# Patient Record
Sex: Female | Born: 1945 | Race: White | Hispanic: No | Marital: Married | Smoking: Never smoker
Health system: Southern US, Community
[De-identification: ages and names within clinical notes are randomized; demographics above are authoritative.]

## PROBLEM LIST (undated history)

## (undated) DIAGNOSIS — C50919 Malignant neoplasm of unspecified site of unspecified female breast: Secondary | ICD-10-CM

## (undated) DIAGNOSIS — E785 Hyperlipidemia, unspecified: Secondary | ICD-10-CM

## (undated) DIAGNOSIS — I10 Essential (primary) hypertension: Secondary | ICD-10-CM

## (undated) DIAGNOSIS — Z7901 Long term (current) use of anticoagulants: Secondary | ICD-10-CM

## (undated) DIAGNOSIS — R002 Palpitations: Secondary | ICD-10-CM

## (undated) DIAGNOSIS — E039 Hypothyroidism, unspecified: Secondary | ICD-10-CM

## (undated) DIAGNOSIS — E875 Hyperkalemia: Secondary | ICD-10-CM

## (undated) DIAGNOSIS — I480 Paroxysmal atrial fibrillation: Secondary | ICD-10-CM

## (undated) DIAGNOSIS — I2510 Atherosclerotic heart disease of native coronary artery without angina pectoris: Secondary | ICD-10-CM

## (undated) HISTORY — DX: Long term (current) use of anticoagulants: Z79.01

## (undated) HISTORY — PX: BREAST BIOPSY: SHX20

## (undated) HISTORY — DX: Atherosclerotic heart disease of native coronary artery without angina pectoris: I25.10

## (undated) HISTORY — DX: Essential (primary) hypertension: I10

## (undated) HISTORY — PX: TUBAL LIGATION: SHX77

## (undated) HISTORY — PX: HALLUX VALGUS CORRECTION: SUR315

## (undated) HISTORY — DX: Malignant neoplasm of unspecified site of unspecified female breast: C50.919

## (undated) HISTORY — DX: Palpitations: R00.2

## (undated) HISTORY — DX: Hyperlipidemia, unspecified: E78.5

## (undated) HISTORY — PX: OTHER SURGICAL HISTORY: SHX169

## (undated) HISTORY — DX: Hypothyroidism, unspecified: E03.9

## (undated) HISTORY — DX: Hyperkalemia: E87.5

## (undated) HISTORY — DX: Paroxysmal atrial fibrillation: I48.0

## (undated) HISTORY — PX: ARTHROPLASTY: SHX135

---

## 2014-08-14 DIAGNOSIS — I480 Paroxysmal atrial fibrillation: Secondary | ICD-10-CM | POA: Insufficient documentation

## 2014-08-14 DIAGNOSIS — I2510 Atherosclerotic heart disease of native coronary artery without angina pectoris: Secondary | ICD-10-CM

## 2014-08-14 DIAGNOSIS — I482 Chronic atrial fibrillation, unspecified: Secondary | ICD-10-CM | POA: Insufficient documentation

## 2014-08-14 DIAGNOSIS — E785 Hyperlipidemia, unspecified: Secondary | ICD-10-CM

## 2014-08-14 DIAGNOSIS — I10 Essential (primary) hypertension: Secondary | ICD-10-CM

## 2014-08-14 DIAGNOSIS — I119 Hypertensive heart disease without heart failure: Secondary | ICD-10-CM | POA: Insufficient documentation

## 2014-08-14 DIAGNOSIS — Z7901 Long term (current) use of anticoagulants: Secondary | ICD-10-CM

## 2014-08-14 HISTORY — DX: Hypertensive heart disease without heart failure: I11.9

## 2014-08-14 HISTORY — DX: Paroxysmal atrial fibrillation: I48.0

## 2014-08-14 HISTORY — DX: Atherosclerotic heart disease of native coronary artery without angina pectoris: I25.10

## 2014-08-14 HISTORY — DX: Chronic atrial fibrillation, unspecified: I48.20

## 2014-08-14 HISTORY — DX: Essential (primary) hypertension: I10

## 2014-08-14 HISTORY — DX: Long term (current) use of anticoagulants: Z79.01

## 2014-08-14 HISTORY — DX: Hyperlipidemia, unspecified: E78.5

## 2016-03-11 DIAGNOSIS — M1811 Unilateral primary osteoarthritis of first carpometacarpal joint, right hand: Secondary | ICD-10-CM | POA: Diagnosis not present

## 2016-03-18 DIAGNOSIS — E785 Hyperlipidemia, unspecified: Secondary | ICD-10-CM | POA: Diagnosis not present

## 2016-03-24 DIAGNOSIS — I2510 Atherosclerotic heart disease of native coronary artery without angina pectoris: Secondary | ICD-10-CM | POA: Diagnosis not present

## 2016-03-24 DIAGNOSIS — T50905A Adverse effect of unspecified drugs, medicaments and biological substances, initial encounter: Secondary | ICD-10-CM | POA: Insufficient documentation

## 2016-03-24 DIAGNOSIS — E875 Hyperkalemia: Secondary | ICD-10-CM

## 2016-03-24 HISTORY — DX: Hyperkalemia: E87.5

## 2016-03-24 HISTORY — DX: Adverse effect of unspecified drugs, medicaments and biological substances, initial encounter: T50.905A

## 2016-03-25 DIAGNOSIS — Z7901 Long term (current) use of anticoagulants: Secondary | ICD-10-CM | POA: Diagnosis not present

## 2016-03-25 DIAGNOSIS — E875 Hyperkalemia: Secondary | ICD-10-CM | POA: Diagnosis not present

## 2016-03-25 DIAGNOSIS — E785 Hyperlipidemia, unspecified: Secondary | ICD-10-CM | POA: Diagnosis not present

## 2016-03-25 DIAGNOSIS — I10 Essential (primary) hypertension: Secondary | ICD-10-CM | POA: Diagnosis not present

## 2016-03-25 DIAGNOSIS — I481 Persistent atrial fibrillation: Secondary | ICD-10-CM | POA: Diagnosis not present

## 2016-04-08 DIAGNOSIS — I480 Paroxysmal atrial fibrillation: Secondary | ICD-10-CM | POA: Diagnosis not present

## 2016-06-24 DIAGNOSIS — E875 Hyperkalemia: Secondary | ICD-10-CM | POA: Diagnosis not present

## 2016-06-24 DIAGNOSIS — I10 Essential (primary) hypertension: Secondary | ICD-10-CM | POA: Diagnosis not present

## 2016-06-24 DIAGNOSIS — Z6824 Body mass index (BMI) 24.0-24.9, adult: Secondary | ICD-10-CM | POA: Diagnosis not present

## 2016-08-04 DIAGNOSIS — E782 Mixed hyperlipidemia: Secondary | ICD-10-CM | POA: Diagnosis not present

## 2016-08-04 DIAGNOSIS — Z1159 Encounter for screening for other viral diseases: Secondary | ICD-10-CM | POA: Diagnosis not present

## 2016-08-04 DIAGNOSIS — Z79899 Other long term (current) drug therapy: Secondary | ICD-10-CM | POA: Diagnosis not present

## 2016-08-04 DIAGNOSIS — Z0000 Encounter for general adult medical examination without abnormal findings: Secondary | ICD-10-CM | POA: Diagnosis not present

## 2016-08-04 DIAGNOSIS — E039 Hypothyroidism, unspecified: Secondary | ICD-10-CM | POA: Diagnosis not present

## 2016-08-04 DIAGNOSIS — I10 Essential (primary) hypertension: Secondary | ICD-10-CM | POA: Diagnosis not present

## 2016-08-21 DIAGNOSIS — H2513 Age-related nuclear cataract, bilateral: Secondary | ICD-10-CM | POA: Diagnosis not present

## 2016-09-17 ENCOUNTER — Other Ambulatory Visit: Payer: Self-pay

## 2016-09-17 DIAGNOSIS — E039 Hypothyroidism, unspecified: Secondary | ICD-10-CM

## 2016-09-17 DIAGNOSIS — R002 Palpitations: Secondary | ICD-10-CM

## 2016-09-17 HISTORY — DX: Palpitations: R00.2

## 2016-09-17 HISTORY — DX: Hypothyroidism, unspecified: E03.9

## 2016-09-22 DIAGNOSIS — Z1231 Encounter for screening mammogram for malignant neoplasm of breast: Secondary | ICD-10-CM | POA: Diagnosis not present

## 2016-09-24 NOTE — Progress Notes (Signed)
Cardiology Office Note:    Date:  09/25/2016   ID:  Gloria Harrington, DOB [birth year], MRN [id number]  PCP:  Mateo Flow, MD  Cardiologist:  Shirlee More, MD    Referring MD: No ref. provider found    ASSESSMENT:    1. Permanent atrial fibrillation ([place])   2. Chronic anticoagulation   3. Hypertensive heart disease without heart failure   4. Hyperlipidemia, unspecified hyperlipidemia type    PLAN:    In order of problems listed above:  1. Stable rate controlled continue her beta blocker and anti-coagulant 2. Stable no bleeding complication continue her anticoagulant. 3. Stable blood pressure target continue  4. Stable continue her statin   Next appointment: [phone]   Medication Adjustments/Labs and Tests Ordered: Current medicines are reviewed at length with the patient today.  Concerns regarding medicines are outlined above.  No orders of the defined types were placed in this encounter.  No orders of the defined types were placed in this encounter.   Chief Complaint  Patient presents with  . Follow-up    Routine 6 month flup appt     History of Present Illness:    Gloria Harrington is a 71 y.o. female with a hx of hypertensive heart disease, Permanent Atrial Fibrillation with anticoagulation, Dyslipidemia, and hyperkalemia with ACEI  last seen one year ago.she is pleased with the quality of her life and remainsl vigorous active and is only short of breath after climbing stairs. She's had no chest pain edema palpitation syncope TIA or bleeding. Compliance with diet, lifestyle and medications: yes Past Medical History:  Diagnosis Date  . Chronic anticoagulation 08/14/2014  . Coronary artery calcification seen on CT scan 08/14/2014  . Essential hypertension 08/14/2014  . Hyperkalemia 03/24/2016  . Hyperlipidemia 08/14/2014  . Hypothyroid 09/17/2016  . PAF (paroxysmal atrial fibrillation) ([place]) 08/14/2014   Overview:  CHADS2 vasc score=4  . Palpitation 09/17/2016    Past  Surgical History:  Procedure Laterality Date  . ARTHROPLASTY     for hammertoe  . BREAST BIOPSY    . HALLUX VALGUS CORRECTION    . open tenotomy of extensor second toe    . TUBAL LIGATION      Current Medications: Current Meds  Medication Sig  . calcium-vitamin D (OSCAL WITH D) 500-200 MG-UNIT tablet Take 1 tablet by mouth 2 (two) times daily.  Marland Kitchen ELIQUIS 5 MG TABS tablet Take 5 mg by mouth 2 (two) times daily.  . Garlic 3151 MG CAPS Take 1 capsule by mouth 2 (two) times daily.  Marland Kitchen levothyroxine (SYNTHROID, LEVOTHROID) 75 MCG tablet Take 75 mcg by mouth daily.  Marland Kitchen lisinopril (PRINIVIL,ZESTRIL) 10 MG tablet Take 10 mg by mouth daily.  . metoprolol tartrate (LOPRESSOR) 25 MG tablet Take 25 mg by mouth 2 (two) times daily.  . Multiple Vitamin (MULTI-VITAMINS) TABS Take 1 tablet by mouth daily.  . Omega-3 Fatty Acids (FISH OIL) 1000 MG CAPS Take 2 capsules by mouth 2 (two) times daily.  . pravastatin (PRAVACHOL) 40 MG tablet Take 40 mg by mouth daily.     Allergies:   Clindamycin; Latex; Nitrofurantoin; Penicillins; Prednisone; and Sulfamethoxazole-trimethoprim   Social History   Social History  . Marital status: Married    Spouse name: N/A  . Number of children: N/A  . Years of education: N/A   Social History Main Topics  . Smoking status: Never Smoker  . Smokeless tobacco: Never Used  . Alcohol use No  . Drug use: No  .  Sexual activity: Not Asked   Other Topics Concern  . None   Social History Narrative  . None     Family History: The patient's family history includes Breast cancer in her mother.all ROS:   Please see the history of present illness.    All other systems reviewed and are negative.  EKGs/Labs/Other Studies Reviewed:    The following studies were reviewed today:  EKG:  EKG ordered today.  The ekg ordered today demonstrates AF controlled rate, PVC's  Recent Labs:all reviewed from her PCP included normal CMP  And CBC No results found for requested  labs within last 8760 hours.  Recent Lipid Panel No results found for: CHOL, TRIG, HDL, CHOLHDL, VLDL, LDLCALC, LDLDIRECT  Physical Exam:    VS:  BP 124/70 (BP Location: Right Arm, Patient Position: Sitting)   Pulse 71   Ht 5\' 5"  (1.651 m)   Wt 141 lb (64 kg)   SpO2 98%   BMI 23.46 kg/m     Wt Readings from Last 3 Encounters:  09/25/16 141 lb (64 kg)     GEN:  Well nourished, well developed in no acute distress HEENT: Normal NECK: No JVD; No carotid bruits LYMPHATICS: No lymphadenopathy CARDIAC: Irr Irr  no murmurs, rubs, gallops RESPIRATORY:  Clear to auscultation without rales, wheezing or rhonchi  ABDOMEN: Soft, non-tender, non-distended MUSCULOSKELETAL:  No edema; No deformity  SKIN: Warm and dry NEUROLOGIC:  Alert and oriented x 3 PSYCHIATRIC:  Normal affect    Signed, Shirlee More, MD  09/25/2016 10:59 AM    [place]

## 2016-09-25 ENCOUNTER — Ambulatory Visit (INDEPENDENT_AMBULATORY_CARE_PROVIDER_SITE_OTHER): Payer: Medicare Other | Admitting: Cardiology

## 2016-09-25 ENCOUNTER — Encounter: Payer: Self-pay | Admitting: Cardiology

## 2016-09-25 VITALS — BP 124/70 | HR 71 | Ht 65.0 in | Wt 141.0 lb

## 2016-09-25 DIAGNOSIS — I119 Hypertensive heart disease without heart failure: Secondary | ICD-10-CM

## 2016-09-25 DIAGNOSIS — E785 Hyperlipidemia, unspecified: Secondary | ICD-10-CM | POA: Diagnosis not present

## 2016-09-25 DIAGNOSIS — I4821 Permanent atrial fibrillation: Secondary | ICD-10-CM

## 2016-09-25 DIAGNOSIS — I482 Chronic atrial fibrillation: Secondary | ICD-10-CM | POA: Diagnosis not present

## 2016-09-25 DIAGNOSIS — Z7901 Long term (current) use of anticoagulants: Secondary | ICD-10-CM | POA: Diagnosis not present

## 2016-09-25 NOTE — Patient Instructions (Signed)
Medication Instructions:  Your physician recommends that you continue on your current medications as directed. Please refer to the Current Medication list given to you today.  Labwork: None  Testing/Procedures: You had an EKG today.  Follow-Up: Your physician wants you to follow-up in: 10 months. You will receive a reminder letter in the mail two months in advance. If you don't receive a letter, please call our office to schedule the follow-up appointment.   Any Other Special Instructions Will Be Listed Below (If Applicable).     If you need a refill on your cardiac medications before your next appointment, please call your pharmacy.

## 2016-10-08 DIAGNOSIS — Z6824 Body mass index (BMI) 24.0-24.9, adult: Secondary | ICD-10-CM | POA: Diagnosis not present

## 2016-10-08 DIAGNOSIS — R1084 Generalized abdominal pain: Secondary | ICD-10-CM | POA: Diagnosis not present

## 2016-10-08 DIAGNOSIS — K5900 Constipation, unspecified: Secondary | ICD-10-CM | POA: Diagnosis not present

## 2016-10-08 DIAGNOSIS — Z79899 Other long term (current) drug therapy: Secondary | ICD-10-CM | POA: Diagnosis not present

## 2016-10-08 DIAGNOSIS — E782 Mixed hyperlipidemia: Secondary | ICD-10-CM | POA: Diagnosis not present

## 2016-10-08 DIAGNOSIS — E039 Hypothyroidism, unspecified: Secondary | ICD-10-CM | POA: Diagnosis not present

## 2016-10-08 DIAGNOSIS — R1083 Colic: Secondary | ICD-10-CM | POA: Diagnosis not present

## 2016-10-08 DIAGNOSIS — R109 Unspecified abdominal pain: Secondary | ICD-10-CM | POA: Diagnosis not present

## 2016-10-09 ENCOUNTER — Other Ambulatory Visit: Payer: Self-pay | Admitting: *Deleted

## 2016-10-09 ENCOUNTER — Other Ambulatory Visit: Payer: Self-pay | Admitting: Family Medicine

## 2016-10-09 DIAGNOSIS — N6321 Unspecified lump in the left breast, upper outer quadrant: Secondary | ICD-10-CM | POA: Diagnosis not present

## 2016-10-09 DIAGNOSIS — N6322 Unspecified lump in the left breast, upper inner quadrant: Secondary | ICD-10-CM | POA: Diagnosis not present

## 2016-10-09 DIAGNOSIS — R928 Other abnormal and inconclusive findings on diagnostic imaging of breast: Secondary | ICD-10-CM | POA: Diagnosis not present

## 2016-10-09 DIAGNOSIS — N6489 Other specified disorders of breast: Secondary | ICD-10-CM

## 2016-10-20 DIAGNOSIS — R748 Abnormal levels of other serum enzymes: Secondary | ICD-10-CM | POA: Diagnosis not present

## 2016-10-20 DIAGNOSIS — Z6824 Body mass index (BMI) 24.0-24.9, adult: Secondary | ICD-10-CM | POA: Diagnosis not present

## 2016-10-20 DIAGNOSIS — R1084 Generalized abdominal pain: Secondary | ICD-10-CM | POA: Diagnosis not present

## 2016-10-20 DIAGNOSIS — K5900 Constipation, unspecified: Secondary | ICD-10-CM | POA: Diagnosis not present

## 2016-10-20 DIAGNOSIS — R1083 Colic: Secondary | ICD-10-CM | POA: Diagnosis not present

## 2016-10-21 ENCOUNTER — Ambulatory Visit
Admission: RE | Admit: 2016-10-21 | Discharge: 2016-10-21 | Disposition: A | Discharge status: Home / Self Care | Payer: Medicare Other | Source: Ambulatory Visit | Attending: Family Medicine | Admitting: Family Medicine

## 2016-10-21 DIAGNOSIS — N6489 Other specified disorders of breast: Secondary | ICD-10-CM

## 2016-10-21 DIAGNOSIS — D0512 Intraductal carcinoma in situ of left breast: Secondary | ICD-10-CM | POA: Diagnosis not present

## 2016-10-21 DIAGNOSIS — R928 Other abnormal and inconclusive findings on diagnostic imaging of breast: Secondary | ICD-10-CM

## 2016-10-28 ENCOUNTER — Encounter: Payer: Self-pay | Admitting: *Deleted

## 2016-10-28 ENCOUNTER — Telehealth: Payer: Self-pay | Admitting: Cardiology

## 2016-10-28 DIAGNOSIS — D0512 Intraductal carcinoma in situ of left breast: Secondary | ICD-10-CM | POA: Diagnosis not present

## 2016-10-28 DIAGNOSIS — C50919 Malignant neoplasm of unspecified site of unspecified female breast: Secondary | ICD-10-CM

## 2016-10-28 HISTORY — DX: Malignant neoplasm of unspecified site of unspecified female breast: C50.919

## 2016-10-28 NOTE — Telephone Encounter (Signed)
Please advise 

## 2016-10-28 NOTE — Telephone Encounter (Signed)
Pam at Dr. Maxie Barb office (new surgeon in [place]) need permission to go off Eliquis for a bx...48 hrs prior to surgery.Marland Kitchen

## 2016-10-29 ENCOUNTER — Telehealth: Payer: Self-pay | Admitting: Cardiology

## 2016-10-29 ENCOUNTER — Other Ambulatory Visit: Payer: Self-pay

## 2016-10-29 MED ORDER — METOPROLOL TARTRATE 25 MG PO TABS: 25.0000 mg | ORAL_TABLET | Freq: Two times a day (BID) | ORAL | 3 refills | Status: DC

## 2016-10-29 NOTE — Telephone Encounter (Signed)
Pam called back and spoke with Abbie, and was advised OK to stop Eliquis, and surgeon to advise when to resume Eliquis.

## 2016-10-29 NOTE — Telephone Encounter (Signed)
Left message for Pam to return call.

## 2016-10-29 NOTE — Telephone Encounter (Signed)
Yesplease ask him to instruct her when to resume Eliquis

## 2016-10-29 NOTE — Telephone Encounter (Signed)
Refill sent.

## 2016-10-29 NOTE — Telephone Encounter (Signed)
°*  STAT* If patient is at the pharmacy, call can be transferred to refill team.   1. Which medications need to be refilled? (please list name of each medication and dose if known) Metoprolol 25mg  take 2 pills daily  2. Which pharmacy/location (including street and city if local pharmacy) is medication to be sent to? [facility] [place]   3. Do they need a 30 day or 90 day supply? [place]

## 2016-10-30 DIAGNOSIS — Z808 Family history of malignant neoplasm of other organs or systems: Secondary | ICD-10-CM | POA: Diagnosis not present

## 2016-10-30 DIAGNOSIS — Z86000 Personal history of in-situ neoplasm of breast: Secondary | ICD-10-CM | POA: Diagnosis not present

## 2016-10-30 DIAGNOSIS — C50412 Malignant neoplasm of upper-outer quadrant of left female breast: Secondary | ICD-10-CM | POA: Diagnosis not present

## 2016-10-30 DIAGNOSIS — Z803 Family history of malignant neoplasm of breast: Secondary | ICD-10-CM | POA: Diagnosis not present

## 2016-10-31 ENCOUNTER — Other Ambulatory Visit: Payer: Self-pay

## 2016-10-31 MED ORDER — METOPROLOL TARTRATE 25 MG PO TABS: 50.0000 mg | ORAL_TABLET | Freq: Every evening | ORAL | 3 refills | Status: DC

## 2016-11-10 DIAGNOSIS — Z79899 Other long term (current) drug therapy: Secondary | ICD-10-CM | POA: Diagnosis not present

## 2016-11-10 DIAGNOSIS — D0512 Intraductal carcinoma in situ of left breast: Secondary | ICD-10-CM | POA: Diagnosis not present

## 2016-11-10 DIAGNOSIS — Z7901 Long term (current) use of anticoagulants: Secondary | ICD-10-CM | POA: Diagnosis not present

## 2016-11-10 DIAGNOSIS — R928 Other abnormal and inconclusive findings on diagnostic imaging of breast: Secondary | ICD-10-CM | POA: Diagnosis not present

## 2016-11-10 DIAGNOSIS — I4891 Unspecified atrial fibrillation: Secondary | ICD-10-CM | POA: Diagnosis not present

## 2016-11-10 DIAGNOSIS — I10 Essential (primary) hypertension: Secondary | ICD-10-CM | POA: Diagnosis not present

## 2016-11-10 DIAGNOSIS — E7800 Pure hypercholesterolemia, unspecified: Secondary | ICD-10-CM | POA: Diagnosis not present

## 2016-11-10 DIAGNOSIS — E039 Hypothyroidism, unspecified: Secondary | ICD-10-CM | POA: Diagnosis not present

## 2016-11-12 ENCOUNTER — Telehealth: Payer: Self-pay | Admitting: Genetic Counselor

## 2016-11-12 NOTE — Telephone Encounter (Signed)
Negative genetic testing on the STAT panel.  We reflexed to the 83 gene multi cancer gene panel.

## 2016-11-18 DIAGNOSIS — D0512 Intraductal carcinoma in situ of left breast: Secondary | ICD-10-CM | POA: Diagnosis not present

## 2016-11-26 DIAGNOSIS — D0512 Intraductal carcinoma in situ of left breast: Secondary | ICD-10-CM | POA: Diagnosis not present

## 2016-11-28 DIAGNOSIS — M8580 Other specified disorders of bone density and structure, unspecified site: Secondary | ICD-10-CM | POA: Diagnosis not present

## 2016-11-28 DIAGNOSIS — Z803 Family history of malignant neoplasm of breast: Secondary | ICD-10-CM | POA: Diagnosis not present

## 2016-11-28 DIAGNOSIS — D0512 Intraductal carcinoma in situ of left breast: Secondary | ICD-10-CM | POA: Diagnosis not present

## 2016-11-28 DIAGNOSIS — D649 Anemia, unspecified: Secondary | ICD-10-CM | POA: Diagnosis not present

## 2016-11-28 DIAGNOSIS — R945 Abnormal results of liver function studies: Secondary | ICD-10-CM | POA: Diagnosis not present

## 2016-11-28 DIAGNOSIS — R748 Abnormal levels of other serum enzymes: Secondary | ICD-10-CM | POA: Diagnosis not present

## 2016-12-02 DIAGNOSIS — R1013 Epigastric pain: Secondary | ICD-10-CM | POA: Diagnosis not present

## 2016-12-03 DIAGNOSIS — D0512 Intraductal carcinoma in situ of left breast: Secondary | ICD-10-CM | POA: Diagnosis not present

## 2016-12-03 DIAGNOSIS — Z510 Encounter for antineoplastic radiation therapy: Secondary | ICD-10-CM | POA: Diagnosis not present

## 2016-12-05 DIAGNOSIS — R109 Unspecified abdominal pain: Secondary | ICD-10-CM | POA: Diagnosis not present

## 2016-12-05 DIAGNOSIS — R1013 Epigastric pain: Secondary | ICD-10-CM | POA: Diagnosis not present

## 2016-12-08 DIAGNOSIS — R1013 Epigastric pain: Secondary | ICD-10-CM | POA: Diagnosis not present

## 2016-12-08 DIAGNOSIS — K253 Acute gastric ulcer without hemorrhage or perforation: Secondary | ICD-10-CM | POA: Diagnosis not present

## 2016-12-08 DIAGNOSIS — K3189 Other diseases of stomach and duodenum: Secondary | ICD-10-CM | POA: Diagnosis not present

## 2016-12-08 DIAGNOSIS — K2970 Gastritis, unspecified, without bleeding: Secondary | ICD-10-CM | POA: Diagnosis not present

## 2016-12-08 DIAGNOSIS — K2980 Duodenitis without bleeding: Secondary | ICD-10-CM | POA: Diagnosis not present

## 2016-12-09 DIAGNOSIS — D0512 Intraductal carcinoma in situ of left breast: Secondary | ICD-10-CM | POA: Diagnosis not present

## 2016-12-09 DIAGNOSIS — R1011 Right upper quadrant pain: Secondary | ICD-10-CM | POA: Diagnosis not present

## 2016-12-09 DIAGNOSIS — K819 Cholecystitis, unspecified: Secondary | ICD-10-CM | POA: Diagnosis not present

## 2016-12-09 DIAGNOSIS — R600 Localized edema: Secondary | ICD-10-CM | POA: Diagnosis not present

## 2016-12-09 DIAGNOSIS — Z510 Encounter for antineoplastic radiation therapy: Secondary | ICD-10-CM | POA: Diagnosis not present

## 2016-12-09 DIAGNOSIS — R748 Abnormal levels of other serum enzymes: Secondary | ICD-10-CM | POA: Diagnosis not present

## 2016-12-10 DIAGNOSIS — Z510 Encounter for antineoplastic radiation therapy: Secondary | ICD-10-CM | POA: Diagnosis not present

## 2016-12-10 DIAGNOSIS — D0512 Intraductal carcinoma in situ of left breast: Secondary | ICD-10-CM | POA: Diagnosis not present

## 2016-12-11 DIAGNOSIS — I5021 Acute systolic (congestive) heart failure: Secondary | ICD-10-CM | POA: Diagnosis not present

## 2016-12-11 DIAGNOSIS — I10 Essential (primary) hypertension: Secondary | ICD-10-CM | POA: Diagnosis not present

## 2016-12-11 DIAGNOSIS — K279 Peptic ulcer, site unspecified, unspecified as acute or chronic, without hemorrhage or perforation: Secondary | ICD-10-CM

## 2016-12-11 DIAGNOSIS — R112 Nausea with vomiting, unspecified: Secondary | ICD-10-CM | POA: Diagnosis not present

## 2016-12-11 DIAGNOSIS — I43 Cardiomyopathy in diseases classified elsewhere: Secondary | ICD-10-CM | POA: Diagnosis not present

## 2016-12-11 DIAGNOSIS — I110 Hypertensive heart disease with heart failure: Secondary | ICD-10-CM | POA: Diagnosis not present

## 2016-12-11 DIAGNOSIS — I482 Chronic atrial fibrillation: Secondary | ICD-10-CM | POA: Diagnosis not present

## 2016-12-11 DIAGNOSIS — R1013 Epigastric pain: Secondary | ICD-10-CM | POA: Diagnosis not present

## 2016-12-11 DIAGNOSIS — E785 Hyperlipidemia, unspecified: Secondary | ICD-10-CM | POA: Diagnosis not present

## 2016-12-11 DIAGNOSIS — I4891 Unspecified atrial fibrillation: Secondary | ICD-10-CM | POA: Diagnosis not present

## 2016-12-11 DIAGNOSIS — R0600 Dyspnea, unspecified: Secondary | ICD-10-CM | POA: Diagnosis not present

## 2016-12-11 DIAGNOSIS — E039 Hypothyroidism, unspecified: Secondary | ICD-10-CM | POA: Diagnosis not present

## 2016-12-11 DIAGNOSIS — R109 Unspecified abdominal pain: Secondary | ICD-10-CM | POA: Diagnosis not present

## 2016-12-11 DIAGNOSIS — R7989 Other specified abnormal findings of blood chemistry: Secondary | ICD-10-CM | POA: Diagnosis not present

## 2016-12-11 DIAGNOSIS — D0512 Intraductal carcinoma in situ of left breast: Secondary | ICD-10-CM | POA: Diagnosis not present

## 2016-12-11 DIAGNOSIS — R740 Nonspecific elevation of levels of transaminase and lactic acid dehydrogenase [LDH]: Secondary | ICD-10-CM | POA: Diagnosis not present

## 2016-12-11 DIAGNOSIS — R000 Tachycardia, unspecified: Secondary | ICD-10-CM | POA: Diagnosis not present

## 2016-12-11 DIAGNOSIS — I509 Heart failure, unspecified: Secondary | ICD-10-CM | POA: Diagnosis not present

## 2016-12-11 DIAGNOSIS — Z7901 Long term (current) use of anticoagulants: Secondary | ICD-10-CM | POA: Diagnosis not present

## 2016-12-11 DIAGNOSIS — E860 Dehydration: Secondary | ICD-10-CM | POA: Diagnosis not present

## 2016-12-11 DIAGNOSIS — N179 Acute kidney failure, unspecified: Secondary | ICD-10-CM | POA: Diagnosis not present

## 2016-12-11 DIAGNOSIS — E875 Hyperkalemia: Secondary | ICD-10-CM | POA: Diagnosis not present

## 2016-12-11 DIAGNOSIS — K810 Acute cholecystitis: Secondary | ICD-10-CM | POA: Diagnosis not present

## 2016-12-11 DIAGNOSIS — K766 Portal hypertension: Secondary | ICD-10-CM | POA: Diagnosis not present

## 2016-12-12 DIAGNOSIS — I4891 Unspecified atrial fibrillation: Secondary | ICD-10-CM

## 2016-12-12 DIAGNOSIS — R000 Tachycardia, unspecified: Secondary | ICD-10-CM

## 2016-12-13 DIAGNOSIS — I4891 Unspecified atrial fibrillation: Secondary | ICD-10-CM | POA: Diagnosis not present

## 2016-12-13 DIAGNOSIS — I10 Essential (primary) hypertension: Secondary | ICD-10-CM | POA: Diagnosis not present

## 2016-12-13 DIAGNOSIS — R7989 Other specified abnormal findings of blood chemistry: Secondary | ICD-10-CM | POA: Diagnosis not present

## 2016-12-16 DIAGNOSIS — D0512 Intraductal carcinoma in situ of left breast: Secondary | ICD-10-CM | POA: Diagnosis not present

## 2016-12-16 DIAGNOSIS — Z510 Encounter for antineoplastic radiation therapy: Secondary | ICD-10-CM | POA: Diagnosis not present

## 2016-12-16 DIAGNOSIS — K269 Duodenal ulcer, unspecified as acute or chronic, without hemorrhage or perforation: Secondary | ICD-10-CM | POA: Diagnosis not present

## 2016-12-16 DIAGNOSIS — M8589 Other specified disorders of bone density and structure, multiple sites: Secondary | ICD-10-CM | POA: Diagnosis not present

## 2016-12-17 DIAGNOSIS — M8589 Other specified disorders of bone density and structure, multiple sites: Secondary | ICD-10-CM | POA: Diagnosis not present

## 2016-12-17 DIAGNOSIS — D0512 Intraductal carcinoma in situ of left breast: Secondary | ICD-10-CM | POA: Diagnosis not present

## 2016-12-17 DIAGNOSIS — Z510 Encounter for antineoplastic radiation therapy: Secondary | ICD-10-CM | POA: Diagnosis not present

## 2016-12-18 DIAGNOSIS — M8589 Other specified disorders of bone density and structure, multiple sites: Secondary | ICD-10-CM | POA: Diagnosis not present

## 2016-12-18 DIAGNOSIS — Z510 Encounter for antineoplastic radiation therapy: Secondary | ICD-10-CM | POA: Diagnosis not present

## 2016-12-18 DIAGNOSIS — D0512 Intraductal carcinoma in situ of left breast: Secondary | ICD-10-CM | POA: Diagnosis not present

## 2016-12-19 DIAGNOSIS — I509 Heart failure, unspecified: Secondary | ICD-10-CM | POA: Diagnosis not present

## 2016-12-19 DIAGNOSIS — Z510 Encounter for antineoplastic radiation therapy: Secondary | ICD-10-CM | POA: Diagnosis not present

## 2016-12-19 DIAGNOSIS — D0512 Intraductal carcinoma in situ of left breast: Secondary | ICD-10-CM | POA: Diagnosis not present

## 2016-12-19 DIAGNOSIS — I110 Hypertensive heart disease with heart failure: Secondary | ICD-10-CM | POA: Diagnosis not present

## 2016-12-19 DIAGNOSIS — M8589 Other specified disorders of bone density and structure, multiple sites: Secondary | ICD-10-CM | POA: Diagnosis not present

## 2016-12-19 DIAGNOSIS — K279 Peptic ulcer, site unspecified, unspecified as acute or chronic, without hemorrhage or perforation: Secondary | ICD-10-CM | POA: Diagnosis not present

## 2016-12-19 DIAGNOSIS — Z7901 Long term (current) use of anticoagulants: Secondary | ICD-10-CM | POA: Diagnosis not present

## 2016-12-19 DIAGNOSIS — E785 Hyperlipidemia, unspecified: Secondary | ICD-10-CM | POA: Diagnosis not present

## 2016-12-19 DIAGNOSIS — Z923 Personal history of irradiation: Secondary | ICD-10-CM | POA: Diagnosis not present

## 2016-12-19 DIAGNOSIS — I43 Cardiomyopathy in diseases classified elsewhere: Secondary | ICD-10-CM | POA: Diagnosis not present

## 2016-12-19 DIAGNOSIS — I482 Chronic atrial fibrillation: Secondary | ICD-10-CM | POA: Diagnosis not present

## 2016-12-22 ENCOUNTER — Ambulatory Visit: Payer: Medicare Other | Admitting: Cardiology

## 2016-12-23 DIAGNOSIS — Z510 Encounter for antineoplastic radiation therapy: Secondary | ICD-10-CM | POA: Diagnosis not present

## 2016-12-23 DIAGNOSIS — D0512 Intraductal carcinoma in situ of left breast: Secondary | ICD-10-CM | POA: Diagnosis not present

## 2016-12-23 DIAGNOSIS — M8589 Other specified disorders of bone density and structure, multiple sites: Secondary | ICD-10-CM | POA: Diagnosis not present

## 2016-12-24 DIAGNOSIS — D0512 Intraductal carcinoma in situ of left breast: Secondary | ICD-10-CM | POA: Diagnosis not present

## 2016-12-24 DIAGNOSIS — I5042 Chronic combined systolic (congestive) and diastolic (congestive) heart failure: Secondary | ICD-10-CM | POA: Insufficient documentation

## 2016-12-24 DIAGNOSIS — M8589 Other specified disorders of bone density and structure, multiple sites: Secondary | ICD-10-CM | POA: Diagnosis not present

## 2016-12-24 DIAGNOSIS — I509 Heart failure, unspecified: Secondary | ICD-10-CM | POA: Insufficient documentation

## 2016-12-24 DIAGNOSIS — D0510 Intraductal carcinoma in situ of unspecified breast: Secondary | ICD-10-CM

## 2016-12-24 DIAGNOSIS — I6529 Occlusion and stenosis of unspecified carotid artery: Secondary | ICD-10-CM | POA: Insufficient documentation

## 2016-12-24 DIAGNOSIS — Z510 Encounter for antineoplastic radiation therapy: Secondary | ICD-10-CM | POA: Diagnosis not present

## 2016-12-24 DIAGNOSIS — K279 Peptic ulcer, site unspecified, unspecified as acute or chronic, without hemorrhage or perforation: Secondary | ICD-10-CM

## 2016-12-24 DIAGNOSIS — I429 Cardiomyopathy, unspecified: Secondary | ICD-10-CM

## 2016-12-24 HISTORY — DX: Peptic ulcer, site unspecified, unspecified as acute or chronic, without hemorrhage or perforation: K27.9

## 2016-12-24 HISTORY — DX: Intraductal carcinoma in situ of unspecified breast: D05.10

## 2016-12-24 HISTORY — DX: Occlusion and stenosis of unspecified carotid artery: I65.29

## 2016-12-24 HISTORY — DX: Chronic combined systolic (congestive) and diastolic (congestive) heart failure: I50.42

## 2016-12-24 HISTORY — DX: Cardiomyopathy, unspecified: I42.9

## 2016-12-24 NOTE — Progress Notes (Deleted)
Cardiology Office Note:    Date:  12/24/2016   ID:  Gloria Harrington, DOB 1945-09-04, MRN [id number]  PCP:  Mateo Flow, MD  Cardiologist:  Shirlee More, MD    Referring MD: Mateo Flow, MD    ASSESSMENT:    1. Permanent atrial fibrillation ([place])   2. Chronic systolic heart failure ([place])   3. Ductal carcinoma in situ (DCIS) of left breast   4. PUD (peptic ulcer disease)    PLAN:    In order of problems listed above:  1. ***   Next appointment: ***   Medication Adjustments/Labs and Tests Ordered: Current medicines are reviewed at length with the patient today.  Concerns regarding medicines are outlined above.  No orders of the defined types were placed in this encounter.  No orders of the defined types were placed in this encounter.   No chief complaint on file.   History of Present Illness:    Gloria Harrington is a 71 y.o. female with a hx of Persistent Atrial Fibrillation since 03/27/15 anticoagulated, Dyslipidemia, HTN and hyperkalemia with ACEI in March 2018.  At that time hypertension and atrial fibrillation was controlled on beta-blocker and calcium channel blocker.  She was recently admitted to [facility] 12/11/2016 with abdominal pain underwent endoscopy and was found to have ulcer disease.  She did not have a GI bleed and her anticoagulation was not withdrawn.  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  Her course was complicated by a recent diagnosis of breast cancer she had a lumpectomy for left breast and anticipate starting radiation therapy.  Serial troponins were undetectable renal function was normal CBC was normal.  Her EKG admission to the hospital showed rapid atrial fibrillation rate of 110  120 bpm with frequent PVCs proBNP level 4610.  At discharge from the hospital her creatinine was 1.0 potassium 4.2 on an ACE inhibitor.  Compliance with diet, lifestyle and medications: *** Past Medical History:  Diagnosis Date  . Breast cancer ([place]) 10/28/2016   Ductal carcinoma of left breast  . Chronic anticoagulation 08/14/2014  . Coronary artery calcification seen on CT scan 08/14/2014  . Essential hypertension 08/14/2014  . Hyperkalemia 03/24/2016  . Hyperlipidemia 08/14/2014  . Hypothyroid 09/17/2016  . PAF (paroxysmal atrial fibrillation) ([place]) 08/14/2014   Overview:  CHADS2 vasc score=4  . Palpitation 09/17/2016    Past Surgical History:  Procedure Laterality Date  . ARTHROPLASTY     for hammertoe  . BREAST BIOPSY    . HALLUX VALGUS CORRECTION    . open tenotomy of extensor second toe    . TUBAL LIGATION      Current Medications: No outpatient medications have been marked as taking for the 12/25/16 encounter (Appointment) with Richardo Priest, MD.     Allergies:   Clindamycin; Latex; Nitrofurantoin; Penicillins; Prednisone; and Sulfamethoxazole-trimethoprim   Social History   Socioeconomic History  . Marital status: Married    Spouse name: Not on file  . Number of children: Not on file  . Years of education: Not on file  . Highest education level: Not on file  Social Needs  . Financial resource strain: Not on file  . Food insecurity - worry: Not on file  . Food insecurity -  inability: Not on file  . Transportation needs - medical: Not on file  . Transportation needs - non-medical: Not on file  Occupational History  . Not on file  Tobacco Use  . Smoking status: Never Smoker  . Smokeless tobacco: Never Used  Substance and Sexual Activity  . Alcohol use: No  . Drug use: No  . Sexual activity: Not on file  Other Topics Concern  . Not on file  Social History Narrative  . Not on file     Family History: The patient's ***family history includes Breast cancer in her  mother. ROS:   Please see the history of present illness.    All other systems reviewed and are negative.  EKGs/Labs/Other Studies Reviewed:    The following studies were reviewed today: Hospital records reviewed and summarized in the history of present illness including ED notes admission note cardiology consultation discharge summary independent review of EKG labs and chest x-ray as well as echocardiogram EKG:  EKG ordered today.  The ekg ordered today demonstrates ***  Recent Labs: No results found for requested labs within last 8760 hours.  Recent Lipid Panel No results found for: CHOL, TRIG, HDL, CHOLHDL, VLDL, LDLCALC, LDLDIRECT  Physical Exam:    VS:  There were no vitals taken for this visit.    Wt Readings from Last 3 Encounters:  09/25/16 141 lb (64 kg)     GEN: *** Well nourished, well developed in no acute distress HEENT: Normal NECK: No JVD; No carotid bruits LYMPHATICS: No lymphadenopathy CARDIAC: ***RRR, no murmurs, rubs, gallops RESPIRATORY:  Clear to auscultation without rales, wheezing or rhonchi  ABDOMEN: Soft, non-tender, non-distended MUSCULOSKELETAL:  No edema; No deformity  SKIN: Warm and dry NEUROLOGIC:  Alert and oriented x 3 PSYCHIATRIC:  Normal affect    Signed, Shirlee More, MD  12/24/2016 9:32 AM    [place]

## 2016-12-25 ENCOUNTER — Ambulatory Visit: Payer: Medicare Other | Admitting: Cardiology

## 2016-12-25 DIAGNOSIS — Z510 Encounter for antineoplastic radiation therapy: Secondary | ICD-10-CM | POA: Diagnosis not present

## 2016-12-25 DIAGNOSIS — M8589 Other specified disorders of bone density and structure, multiple sites: Secondary | ICD-10-CM | POA: Diagnosis not present

## 2016-12-25 DIAGNOSIS — D0512 Intraductal carcinoma in situ of left breast: Secondary | ICD-10-CM | POA: Diagnosis not present

## 2016-12-26 DIAGNOSIS — E785 Hyperlipidemia, unspecified: Secondary | ICD-10-CM | POA: Diagnosis not present

## 2016-12-26 DIAGNOSIS — Z7901 Long term (current) use of anticoagulants: Secondary | ICD-10-CM | POA: Diagnosis not present

## 2016-12-26 DIAGNOSIS — Z510 Encounter for antineoplastic radiation therapy: Secondary | ICD-10-CM | POA: Diagnosis not present

## 2016-12-26 DIAGNOSIS — I509 Heart failure, unspecified: Secondary | ICD-10-CM | POA: Diagnosis not present

## 2016-12-26 DIAGNOSIS — I43 Cardiomyopathy in diseases classified elsewhere: Secondary | ICD-10-CM | POA: Diagnosis not present

## 2016-12-26 DIAGNOSIS — D0512 Intraductal carcinoma in situ of left breast: Secondary | ICD-10-CM | POA: Diagnosis not present

## 2016-12-26 DIAGNOSIS — M8589 Other specified disorders of bone density and structure, multiple sites: Secondary | ICD-10-CM | POA: Diagnosis not present

## 2016-12-26 DIAGNOSIS — I482 Chronic atrial fibrillation: Secondary | ICD-10-CM | POA: Diagnosis not present

## 2016-12-26 DIAGNOSIS — I110 Hypertensive heart disease with heart failure: Secondary | ICD-10-CM | POA: Diagnosis not present

## 2016-12-26 DIAGNOSIS — Z923 Personal history of irradiation: Secondary | ICD-10-CM | POA: Diagnosis not present

## 2016-12-26 DIAGNOSIS — K279 Peptic ulcer, site unspecified, unspecified as acute or chronic, without hemorrhage or perforation: Secondary | ICD-10-CM | POA: Diagnosis not present

## 2016-12-28 NOTE — Progress Notes (Signed)
Cardiology Office Note:    Date:  12/29/2016   ID:  DURENE DODGE, DOB [birth year], MRN [id number]  PCP:  Mateo Flow, MD  Cardiologist:  Shirlee More, MD    Referring MD: Mateo Flow, MD    ASSESSMENT:    1. Cardiomyopathy, secondary ([place])   2. Chronic systolic heart failure (H[facility])   3. Persistent atrial fibrillation ([place])   4. Chronic anticoagulation   5. Hypertensive heart disease with chronic systolic congestive heart failure ([place])   6. Ductal carcinoma in situ (DCIS) of left breast   7. PUD (peptic ulcer disease)    PLAN:    In order of problems listed above:  1. Clinically her cardiomyopathy is secondary to atrial fibrillation.  Her mitral regurgitation is not severe and although she has coronary artery calcification approximately 5 years ago she had a normal stress echo and does not have a pattern of angina.  For further evaluation she will undergo myocardial perfusion study with quantitative ejection fraction in the next few weeks.  Her heart failure is compensated at this point in time. 2. Stable compensated continue her diuretic recheck labs BMP BNP plan echocardiogram at the end of January continue her beta-blocker heart rate is controlled and transition from ACE inhibitor to ARNI.  When she finishes radiation therapy will make a decision whether we should attempt to resume sinus rhythm with atrial fibrillation tachycardia induced cardiomyopathy. 3. Stable rate controlled continue her anticoagulant and stay on a PPI indefinitely with a history of ulcer disease 4. Stable continue her anticoagulant 5. Blood pressure stable continue current treatment with changes as detailed above 6. She is in the midst of radiation therapy the L1 priority is to finish her cancer treatment optimize her heart failure treatment and heart rate control for atrial fibrillation and reassess after she finishes radiation therapy regarding the need to consider resumption of sinus rhythm with atrial  fibrillation tachycardia induced cardiomyopathy.  I will send a copy 9 note to her oncologist   Next appointment: 3 weeks   Medication Adjustments/Labs and Tests Ordered: Current medicines are reviewed at length with the patient today.  Concerns regarding medicines are outlined above.  Orders Placed This Encounter  Procedures  . Pro b natriuretic peptide (BNP)  . Basic metabolic panel  . Myocardial Perfusion Imaging   Meds ordered this encounter  Medications  . sacubitril-valsartan (ENTRESTO) 24-26 MG    Sig: Take 1 tablet by mouth 2 (two) times daily. Do not start until you are off lisinopril for 3 full days    Dispense:  60 tablet    Refill:  3  . furosemide (LASIX) 20 MG tablet    Sig: Take 1 tablet (20 mg total) by mouth 2 (two) times daily.    Dispense:  60 tablet    Refill:  3  . metoprolol tartrate (LOPRESSOR) 50 MG tablet    Sig: Take 1 tablet (50 mg total) by mouth 2 (two) times daily.    Dispense:  60 tablet    Refill:  3    Chief Complaint  Patient presents with  . Hospitalization Follow-up  . Congestive Heart Failure    History of Present Illness:    Gloria Harrington is a 71 y.o. female with a hx of Persistent Atrial Fibrillation since march 2017 anticoagulated, Dyslipidemia, HTN and hyperkalemia with ACEI   last seen in March 2018. She recently was diagnosed with ductal carcinoma in situ left breast she has had mastectomy and is  scheduled to initiate radiation therapy.  She was recently admitted to [facility] with abdominal pain there is fluid about the gallbladder but no stones and she was noted to have an elevated BNP level 4610 EF 40% and chest x-ray findings of congestive heart failure.  She was not taking a beta-blocker as directed heart rate was not well controlled with seen by cardiologist and felt to have tachycardia induced cardiomyopathy.  Her beta-blocker dosage was increased she was placed on a loop diuretic and ACE inhibitor at  discharge.  She  did not have an ischemia evaluation.  At the time of discharge from the hospital CBC was normal hemoglobin 13.6 creatinine 1.10 potassium 4.3.  She did not have a repeat BNP level and troponins were normal although TSH was elevated 8.0 Compliance with diet, lifestyle and medications: Yes She feels improved her weight is down 2 pounds she restrict sodium manages medications and weights daily.  She had several months of severe abdominal pain and I doubt that heart failure was the etiology of this and most likely due to her ulcer disease and improvement with the PPI.  She has had no angina syncope or palpitation Past Medical History:  Diagnosis Date  . Breast cancer ([place]) 10/28/2016   Ductal carcinoma of left breast  . Chronic anticoagulation 08/14/2014  . Coronary artery calcification seen on CT scan 08/14/2014  . Essential hypertension 08/14/2014  . Hyperkalemia 03/24/2016  . Hyperlipidemia 08/14/2014  . Hypothyroid 09/17/2016  . PAF (paroxysmal atrial fibrillation) ([place]) 08/14/2014   Overview:  CHADS2 vasc score=4  . Palpitation 09/17/2016    Past Surgical History:  Procedure Laterality Date  . ARTHROPLASTY     for hammertoe  . BREAST BIOPSY    . HALLUX VALGUS CORRECTION    . open tenotomy of extensor second toe    . TUBAL LIGATION      Current Medications: Current Meds  Medication Sig  . calcium-vitamin D (OSCAL WITH D) 500-200 MG-UNIT tablet Take 1 tablet by mouth 2 (two) times daily.  Marland Kitchen ELIQUIS 5 MG TABS tablet Take 5 mg by mouth 2 (two) times daily.  . furosemide (LASIX) 20 MG tablet Take 1 tablet (20 mg total) by mouth 2 (two) times daily.  . Garlic 4098 MG CAPS Take 1 capsule by mouth 2 (two) times daily.  Marland Kitchen levothyroxine (SYNTHROID, LEVOTHROID) 75 MCG tablet Take 75 mcg by mouth daily.  . metoprolol tartrate (LOPRESSOR) 50 MG tablet Take 1 tablet (50 mg total) by mouth 2 (two) times daily.  . Multiple Vitamin (MULTI-VITAMINS) TABS Take 1 tablet by mouth daily.  . Omega-3 Fatty Acids  (FISH OIL) 1000 MG CAPS Take 2 capsules by mouth 2 (two) times daily.  . pantoprazole (PROTONIX) 40 MG tablet TK 1 T PO D  . pravastatin (PRAVACHOL) 40 MG tablet Take 40 mg by mouth daily.  . [DISCONTINUED] furosemide (LASIX) 20 MG tablet TK 1 T PO BID  . [DISCONTINUED] lisinopril (PRINIVIL,ZESTRIL) 10 MG tablet Take 10 mg by mouth daily.  . [DISCONTINUED] metoprolol tartrate (LOPRESSOR) 50 MG tablet TK 1 T PO BID PRN     Allergies:   Clindamycin; Latex; Nitrofurantoin; Penicillins; Prednisone; and Sulfamethoxazole-trimethoprim   Social History   Socioeconomic History  . Marital status: Married    Spouse name: None  . Number of children: None  . Years of education: None  . Highest education level: None  Social Needs  . Financial resource strain: None  . Food insecurity - worry: None  .  Food insecurity - inability: None  . Transportation needs - medical: None  . Transportation needs - non-medical: None  Occupational History  . None  Tobacco Use  . Smoking status: Never Smoker  . Smokeless tobacco: Never Used  Substance and Sexual Activity  . Alcohol use: No  . Drug use: No  . Sexual activity: None  Other Topics Concern  . None  Social History Narrative  . None     Family History: The patient's family history includes Breast cancer in her mother. ROS:   Please see the history of present illness.    All other systems reviewed and are negative.  EKGs/Labs/Other Studies Reviewed:    The following studies were reviewed today:  Echo with EF 40% mild biatrial enlargement and moderate MR  Recent Labs: No results found for requested labs within last 8760 hours.  Recent Lipid Panel No results found for: CHOL, TRIG, HDL, CHOLHDL, VLDL, LDLCALC, LDLDIRECT  Physical Exam:    VS:  BP 104/68 (BP Location: Right Arm, Patient Position: Sitting, Cuff Size: Normal)   Pulse (!) 56   Ht 5\' 5"  (1.651 m)   Wt 139 lb (63 kg)   SpO2 99%   BMI 23.13 kg/m     Wt Readings from  Last 3 Encounters:  12/29/16 139 lb (63 kg)  09/25/16 141 lb (64 kg)     GEN:  Well nourished, well developed in no acute distress HEENT: Normal NECK: No JVD; No carotid bruits LYMPHATICS: No lymphadenopathy CARDIAC: IrrIrr 2/6 MRRRR, no murmurs, rubs, gallops RESPIRATORY:  Clear to auscultation without rales, wheezing or rhonchi  ABDOMEN: Soft, non-tender, non-distended MUSCULOSKELETAL:  No edema; No deformity  SKIN: Warm and dry NEUROLOGIC:  Alert and oriented x 3 PSYCHIATRIC:  Normal affect    Signed, Shirlee More, MD  12/29/2016 3:44 PM    [facility]

## 2016-12-29 ENCOUNTER — Encounter: Payer: Self-pay | Admitting: Cardiology

## 2016-12-29 ENCOUNTER — Ambulatory Visit: Payer: Medicare Other | Admitting: Cardiology

## 2016-12-29 VITALS — BP 104/68 | HR 56 | Ht 65.0 in | Wt 139.0 lb

## 2016-12-29 DIAGNOSIS — D0512 Intraductal carcinoma in situ of left breast: Secondary | ICD-10-CM | POA: Diagnosis not present

## 2016-12-29 DIAGNOSIS — I5022 Chronic systolic (congestive) heart failure: Secondary | ICD-10-CM | POA: Diagnosis not present

## 2016-12-29 DIAGNOSIS — M8589 Other specified disorders of bone density and structure, multiple sites: Secondary | ICD-10-CM | POA: Diagnosis not present

## 2016-12-29 DIAGNOSIS — I481 Persistent atrial fibrillation: Secondary | ICD-10-CM | POA: Diagnosis not present

## 2016-12-29 DIAGNOSIS — K279 Peptic ulcer, site unspecified, unspecified as acute or chronic, without hemorrhage or perforation: Secondary | ICD-10-CM

## 2016-12-29 DIAGNOSIS — Z510 Encounter for antineoplastic radiation therapy: Secondary | ICD-10-CM | POA: Diagnosis not present

## 2016-12-29 DIAGNOSIS — I110 Hypertensive heart disease with heart failure: Secondary | ICD-10-CM | POA: Diagnosis not present

## 2016-12-29 DIAGNOSIS — I4819 Other persistent atrial fibrillation: Secondary | ICD-10-CM

## 2016-12-29 DIAGNOSIS — Z7901 Long term (current) use of anticoagulants: Secondary | ICD-10-CM | POA: Diagnosis not present

## 2016-12-29 DIAGNOSIS — I429 Cardiomyopathy, unspecified: Secondary | ICD-10-CM

## 2016-12-29 MED ORDER — FUROSEMIDE 20 MG PO TABS: 20.0000 mg | ORAL_TABLET | Freq: Two times a day (BID) | ORAL | 3 refills | Status: DC

## 2016-12-29 MED ORDER — METOPROLOL TARTRATE 50 MG PO TABS: 50.0000 mg | ORAL_TABLET | Freq: Two times a day (BID) | ORAL | 3 refills | Status: DC

## 2016-12-29 MED ORDER — SACUBITRIL-VALSARTAN 24-26 MG PO TABS: 1.0000 | ORAL_TABLET | Freq: Two times a day (BID) | ORAL | 3 refills | Status: DC

## 2016-12-29 NOTE — Patient Instructions (Addendum)
Medication Instructions:  Your physician has recommended you make the following change in your medication:  STOP lisinopril  START sacubitril-valsartan (Entresto) 24-26 mg twice daily. DO NOT start Entresto until you have been off lisinopril for 3 days.  Labwork: Your physician recommends that you return for lab work in: today. BMP, BNP  Testing/Procedures: Your physician has requested that you have en exercise stress myoview. For further information please visit [email or web address]. Please follow instruction sheet, as given.  Follow-Up: Your physician recommends that you schedule a follow-up appointment in: 3 weeks.  Any Other Special Instructions Will Be Listed Below (If Applicable).     If you need a refill on your cardiac medications before your next appointment, please call your pharmacy.  Check your HR and BP twice daily and record, call if BP < 349 systolic or HR are consistently > 100  DO NOT STAT ENTRESTO UNTIL YOU ARE OFF LISINOPRIL FOR 3 FULL DAYS   Heart Failure  Weigh yourself every morning when you first wake up and record on a calender or note pad, bring this to your office visits. Using a pill tender can help with taking your medications consistently.  Limit your fluid intake to 2 liters daily  Limit your sodium intake to less than 2-3 grams daily. Ask if you need dietary teaching.  If you gain more than 3 pounds (from your dry weight ), double your dose of diuretic for the day.  If you gain more than 5 pounds (from your dry weight), double your dose of lasix and call your heart failure doctor.  Please do not smoke tobacco since it is very bad for your heart.  Please do not drink alcohol since it can worsen your heart failure.Also avoid OTC nonsteroidal drugs, such as advil, aleve and motrin.  Try to exercise for at least 30 minutes every day because this will help your heart be more efficient. You may be eligible for supervised cardiac rehab, ask your  physician.

## 2016-12-30 ENCOUNTER — Telehealth: Payer: Self-pay | Admitting: Cardiology

## 2016-12-30 DIAGNOSIS — D0512 Intraductal carcinoma in situ of left breast: Secondary | ICD-10-CM | POA: Diagnosis not present

## 2016-12-30 DIAGNOSIS — Z923 Personal history of irradiation: Secondary | ICD-10-CM | POA: Diagnosis not present

## 2016-12-30 DIAGNOSIS — Z510 Encounter for antineoplastic radiation therapy: Secondary | ICD-10-CM | POA: Diagnosis not present

## 2016-12-30 DIAGNOSIS — I482 Chronic atrial fibrillation: Secondary | ICD-10-CM | POA: Diagnosis not present

## 2016-12-30 DIAGNOSIS — M8589 Other specified disorders of bone density and structure, multiple sites: Secondary | ICD-10-CM | POA: Diagnosis not present

## 2016-12-30 DIAGNOSIS — Z7901 Long term (current) use of anticoagulants: Secondary | ICD-10-CM | POA: Diagnosis not present

## 2016-12-30 DIAGNOSIS — I43 Cardiomyopathy in diseases classified elsewhere: Secondary | ICD-10-CM | POA: Diagnosis not present

## 2016-12-30 DIAGNOSIS — I110 Hypertensive heart disease with heart failure: Secondary | ICD-10-CM | POA: Diagnosis not present

## 2016-12-30 DIAGNOSIS — E785 Hyperlipidemia, unspecified: Secondary | ICD-10-CM | POA: Diagnosis not present

## 2016-12-30 DIAGNOSIS — I509 Heart failure, unspecified: Secondary | ICD-10-CM | POA: Diagnosis not present

## 2016-12-30 DIAGNOSIS — K279 Peptic ulcer, site unspecified, unspecified as acute or chronic, without hemorrhage or perforation: Secondary | ICD-10-CM | POA: Diagnosis not present

## 2016-12-30 LAB — BASIC METABOLIC PANEL
BUN/Creatinine Ratio: 23 (ref 12–28)
BUN: 28 mg/dL — ABNORMAL HIGH (ref 8–27)
CHLORIDE: 101 mmol/L (ref 96–106)
CO2: 24 mmol/L (ref 20–29)
Calcium: 9.8 mg/dL (ref 8.7–10.3)
Creatinine, Ser: 1.22 mg/dL — ABNORMAL HIGH (ref 0.57–1.00)
GFR calc Af Amer: 52 mL/min/{1.73_m2} — ABNORMAL LOW (ref >59)
GFR, EST NON AFRICAN AMERICAN: 45 mL/min/{1.73_m2} — AB (ref >59)
Glucose: 108 mg/dL — ABNORMAL HIGH (ref 65–99)
POTASSIUM: 3.8 mmol/L (ref 3.5–5.2)
SODIUM: 142 mmol/L (ref 134–144)

## 2016-12-30 LAB — PRO B NATRIURETIC PEPTIDE: NT-Pro BNP: 3906 pg/mL — ABNORMAL HIGH (ref 0–301)

## 2016-12-30 NOTE — Telephone Encounter (Signed)
Please call her regarding some questions for Entresto.Marland Kitchen

## 2016-12-31 DIAGNOSIS — E782 Mixed hyperlipidemia: Secondary | ICD-10-CM | POA: Diagnosis not present

## 2016-12-31 DIAGNOSIS — Z510 Encounter for antineoplastic radiation therapy: Secondary | ICD-10-CM | POA: Diagnosis not present

## 2016-12-31 DIAGNOSIS — M8589 Other specified disorders of bone density and structure, multiple sites: Secondary | ICD-10-CM | POA: Diagnosis not present

## 2016-12-31 DIAGNOSIS — D0512 Intraductal carcinoma in situ of left breast: Secondary | ICD-10-CM | POA: Diagnosis not present

## 2016-12-31 DIAGNOSIS — M1990 Unspecified osteoarthritis, unspecified site: Secondary | ICD-10-CM | POA: Diagnosis not present

## 2016-12-31 DIAGNOSIS — M859 Disorder of bone density and structure, unspecified: Secondary | ICD-10-CM | POA: Diagnosis not present

## 2016-12-31 DIAGNOSIS — Z79899 Other long term (current) drug therapy: Secondary | ICD-10-CM | POA: Diagnosis not present

## 2016-12-31 DIAGNOSIS — E039 Hypothyroidism, unspecified: Secondary | ICD-10-CM | POA: Diagnosis not present

## 2016-12-31 DIAGNOSIS — Z13820 Encounter for screening for osteoporosis: Secondary | ICD-10-CM | POA: Diagnosis not present

## 2016-12-31 NOTE — Telephone Encounter (Signed)
Clarified with patient not to start Entresto until after she has been off lisinopril for 3 full days (Thursday). Patient verbalized understanding.

## 2017-01-01 DIAGNOSIS — D0512 Intraductal carcinoma in situ of left breast: Secondary | ICD-10-CM | POA: Diagnosis not present

## 2017-01-01 DIAGNOSIS — M8589 Other specified disorders of bone density and structure, multiple sites: Secondary | ICD-10-CM | POA: Diagnosis not present

## 2017-01-01 DIAGNOSIS — Z510 Encounter for antineoplastic radiation therapy: Secondary | ICD-10-CM | POA: Diagnosis not present

## 2017-01-02 ENCOUNTER — Telehealth: Payer: Self-pay | Admitting: Cardiology

## 2017-01-02 ENCOUNTER — Other Ambulatory Visit: Payer: Self-pay

## 2017-01-02 DIAGNOSIS — I110 Hypertensive heart disease with heart failure: Secondary | ICD-10-CM | POA: Diagnosis not present

## 2017-01-02 DIAGNOSIS — E785 Hyperlipidemia, unspecified: Secondary | ICD-10-CM | POA: Diagnosis not present

## 2017-01-02 DIAGNOSIS — M8589 Other specified disorders of bone density and structure, multiple sites: Secondary | ICD-10-CM | POA: Diagnosis not present

## 2017-01-02 DIAGNOSIS — I509 Heart failure, unspecified: Secondary | ICD-10-CM | POA: Diagnosis not present

## 2017-01-02 DIAGNOSIS — Z510 Encounter for antineoplastic radiation therapy: Secondary | ICD-10-CM | POA: Diagnosis not present

## 2017-01-02 DIAGNOSIS — Z923 Personal history of irradiation: Secondary | ICD-10-CM | POA: Diagnosis not present

## 2017-01-02 DIAGNOSIS — D0512 Intraductal carcinoma in situ of left breast: Secondary | ICD-10-CM | POA: Diagnosis not present

## 2017-01-02 DIAGNOSIS — I43 Cardiomyopathy in diseases classified elsewhere: Secondary | ICD-10-CM | POA: Diagnosis not present

## 2017-01-02 DIAGNOSIS — I482 Chronic atrial fibrillation: Secondary | ICD-10-CM | POA: Diagnosis not present

## 2017-01-02 DIAGNOSIS — Z7901 Long term (current) use of anticoagulants: Secondary | ICD-10-CM | POA: Diagnosis not present

## 2017-01-02 DIAGNOSIS — K279 Peptic ulcer, site unspecified, unspecified as acute or chronic, without hemorrhage or perforation: Secondary | ICD-10-CM | POA: Diagnosis not present

## 2017-01-02 MED ORDER — ELIQUIS 5 MG PO TABS: 5.0000 mg | ORAL_TABLET | Freq: Two times a day (BID) | ORAL | 3 refills | Status: DC

## 2017-01-02 NOTE — Telephone Encounter (Signed)
°*  STAT* If patient is at the pharmacy, call can be transferred to refill team.   1. Which medications need to be refilled? (please list name of each medication and dose if known) Eliquis  2. Which pharmacy/location (including street and city if local pharmacy) is medication to be sent to? [facility] [place]   3. Do they need a 30 day or 90 day supply? 90  She is out send in please today!!

## 2017-01-02 NOTE — Telephone Encounter (Signed)
Refill sent.

## 2017-01-05 DIAGNOSIS — D0512 Intraductal carcinoma in situ of left breast: Secondary | ICD-10-CM | POA: Diagnosis not present

## 2017-01-05 DIAGNOSIS — Z510 Encounter for antineoplastic radiation therapy: Secondary | ICD-10-CM | POA: Diagnosis not present

## 2017-01-05 DIAGNOSIS — M8589 Other specified disorders of bone density and structure, multiple sites: Secondary | ICD-10-CM | POA: Diagnosis not present

## 2017-01-07 ENCOUNTER — Other Ambulatory Visit: Payer: Self-pay

## 2017-01-07 DIAGNOSIS — Z510 Encounter for antineoplastic radiation therapy: Secondary | ICD-10-CM | POA: Diagnosis not present

## 2017-01-07 DIAGNOSIS — M8589 Other specified disorders of bone density and structure, multiple sites: Secondary | ICD-10-CM | POA: Diagnosis not present

## 2017-01-07 DIAGNOSIS — D0512 Intraductal carcinoma in situ of left breast: Secondary | ICD-10-CM | POA: Diagnosis not present

## 2017-01-07 MED ORDER — METOPROLOL TARTRATE 50 MG PO TABS: 50.0000 mg | ORAL_TABLET | Freq: Two times a day (BID) | ORAL | 6 refills | Status: DC

## 2017-01-08 DIAGNOSIS — M8589 Other specified disorders of bone density and structure, multiple sites: Secondary | ICD-10-CM | POA: Diagnosis not present

## 2017-01-08 DIAGNOSIS — D0512 Intraductal carcinoma in situ of left breast: Secondary | ICD-10-CM | POA: Diagnosis not present

## 2017-01-08 DIAGNOSIS — Z510 Encounter for antineoplastic radiation therapy: Secondary | ICD-10-CM | POA: Diagnosis not present

## 2017-01-09 DIAGNOSIS — I43 Cardiomyopathy in diseases classified elsewhere: Secondary | ICD-10-CM | POA: Diagnosis not present

## 2017-01-09 DIAGNOSIS — I110 Hypertensive heart disease with heart failure: Secondary | ICD-10-CM | POA: Diagnosis not present

## 2017-01-09 DIAGNOSIS — Z7901 Long term (current) use of anticoagulants: Secondary | ICD-10-CM | POA: Diagnosis not present

## 2017-01-09 DIAGNOSIS — M8589 Other specified disorders of bone density and structure, multiple sites: Secondary | ICD-10-CM | POA: Diagnosis not present

## 2017-01-09 DIAGNOSIS — Z510 Encounter for antineoplastic radiation therapy: Secondary | ICD-10-CM | POA: Diagnosis not present

## 2017-01-09 DIAGNOSIS — E785 Hyperlipidemia, unspecified: Secondary | ICD-10-CM | POA: Diagnosis not present

## 2017-01-09 DIAGNOSIS — D0512 Intraductal carcinoma in situ of left breast: Secondary | ICD-10-CM | POA: Diagnosis not present

## 2017-01-09 DIAGNOSIS — Z923 Personal history of irradiation: Secondary | ICD-10-CM | POA: Diagnosis not present

## 2017-01-09 DIAGNOSIS — I509 Heart failure, unspecified: Secondary | ICD-10-CM | POA: Diagnosis not present

## 2017-01-09 DIAGNOSIS — I482 Chronic atrial fibrillation: Secondary | ICD-10-CM | POA: Diagnosis not present

## 2017-01-09 DIAGNOSIS — K279 Peptic ulcer, site unspecified, unspecified as acute or chronic, without hemorrhage or perforation: Secondary | ICD-10-CM | POA: Diagnosis not present

## 2017-01-12 DIAGNOSIS — D0512 Intraductal carcinoma in situ of left breast: Secondary | ICD-10-CM | POA: Diagnosis not present

## 2017-01-12 DIAGNOSIS — M8589 Other specified disorders of bone density and structure, multiple sites: Secondary | ICD-10-CM | POA: Diagnosis not present

## 2017-01-12 DIAGNOSIS — Z510 Encounter for antineoplastic radiation therapy: Secondary | ICD-10-CM | POA: Diagnosis not present

## 2017-01-14 DIAGNOSIS — D0512 Intraductal carcinoma in situ of left breast: Secondary | ICD-10-CM | POA: Diagnosis not present

## 2017-01-14 DIAGNOSIS — Z510 Encounter for antineoplastic radiation therapy: Secondary | ICD-10-CM | POA: Diagnosis not present

## 2017-01-14 DIAGNOSIS — M8589 Other specified disorders of bone density and structure, multiple sites: Secondary | ICD-10-CM | POA: Diagnosis not present

## 2017-01-15 DIAGNOSIS — D0512 Intraductal carcinoma in situ of left breast: Secondary | ICD-10-CM | POA: Diagnosis not present

## 2017-01-15 DIAGNOSIS — M8589 Other specified disorders of bone density and structure, multiple sites: Secondary | ICD-10-CM | POA: Diagnosis not present

## 2017-01-15 DIAGNOSIS — Z510 Encounter for antineoplastic radiation therapy: Secondary | ICD-10-CM | POA: Diagnosis not present

## 2017-01-16 DIAGNOSIS — E785 Hyperlipidemia, unspecified: Secondary | ICD-10-CM | POA: Diagnosis not present

## 2017-01-16 DIAGNOSIS — I509 Heart failure, unspecified: Secondary | ICD-10-CM | POA: Diagnosis not present

## 2017-01-16 DIAGNOSIS — K279 Peptic ulcer, site unspecified, unspecified as acute or chronic, without hemorrhage or perforation: Secondary | ICD-10-CM | POA: Diagnosis not present

## 2017-01-16 DIAGNOSIS — I482 Chronic atrial fibrillation: Secondary | ICD-10-CM | POA: Diagnosis not present

## 2017-01-16 DIAGNOSIS — I43 Cardiomyopathy in diseases classified elsewhere: Secondary | ICD-10-CM | POA: Diagnosis not present

## 2017-01-16 DIAGNOSIS — Z510 Encounter for antineoplastic radiation therapy: Secondary | ICD-10-CM | POA: Diagnosis not present

## 2017-01-16 DIAGNOSIS — I110 Hypertensive heart disease with heart failure: Secondary | ICD-10-CM | POA: Diagnosis not present

## 2017-01-16 DIAGNOSIS — Z7901 Long term (current) use of anticoagulants: Secondary | ICD-10-CM | POA: Diagnosis not present

## 2017-01-16 DIAGNOSIS — Z923 Personal history of irradiation: Secondary | ICD-10-CM | POA: Diagnosis not present

## 2017-01-16 DIAGNOSIS — D0512 Intraductal carcinoma in situ of left breast: Secondary | ICD-10-CM | POA: Diagnosis not present

## 2017-01-16 DIAGNOSIS — M8589 Other specified disorders of bone density and structure, multiple sites: Secondary | ICD-10-CM | POA: Diagnosis not present

## 2017-01-19 ENCOUNTER — Encounter: Payer: Self-pay | Admitting: Cardiology

## 2017-01-19 ENCOUNTER — Ambulatory Visit: Payer: Medicare Other | Admitting: Cardiology

## 2017-01-19 VITALS — BP 130/74 | HR 66 | Ht 65.0 in | Wt 139.0 lb

## 2017-01-19 DIAGNOSIS — Z7901 Long term (current) use of anticoagulants: Secondary | ICD-10-CM | POA: Diagnosis not present

## 2017-01-19 DIAGNOSIS — D0512 Intraductal carcinoma in situ of left breast: Secondary | ICD-10-CM | POA: Diagnosis not present

## 2017-01-19 DIAGNOSIS — Z510 Encounter for antineoplastic radiation therapy: Secondary | ICD-10-CM | POA: Diagnosis not present

## 2017-01-19 DIAGNOSIS — I481 Persistent atrial fibrillation: Secondary | ICD-10-CM | POA: Diagnosis not present

## 2017-01-19 DIAGNOSIS — I4819 Other persistent atrial fibrillation: Secondary | ICD-10-CM

## 2017-01-19 DIAGNOSIS — I340 Nonrheumatic mitral (valve) insufficiency: Secondary | ICD-10-CM | POA: Diagnosis not present

## 2017-01-19 DIAGNOSIS — M8589 Other specified disorders of bone density and structure, multiple sites: Secondary | ICD-10-CM | POA: Diagnosis not present

## 2017-01-19 DIAGNOSIS — I5022 Chronic systolic (congestive) heart failure: Secondary | ICD-10-CM

## 2017-01-19 MED ORDER — FUROSEMIDE 20 MG PO TABS: 20.0000 mg | ORAL_TABLET | Freq: Every day | ORAL | 3 refills | Status: DC

## 2017-01-19 NOTE — Patient Instructions (Addendum)
Medication Instructions:  Your physician recommends that you continue on your current medications as directed. Please refer to the Current Medication list given to you today.  Labwork: Your physician recommends that you return for lab work on: March 1st at [facility].  Testing/Procedures: None  Follow-Up: Your physician recommends that you schedule a follow-up appointment in: 3 months.  Any Other Special Instructions Will Be Listed Below (If Applicable).     If you need a refill on your cardiac medications before your next appointment, please call your pharmacy.    1. Avoid all over-the-counter antihistamines except Claritin/Loratadine and Zyrtec/Cetrizine. 2. Avoid all combination including cold sinus allergies flu decongestant and sleep medications 3. You can use Robitussin DM Mucinex and Mucinex DM for cough. 4. can use Tylenol aspirin ibuprofen and naproxen but no combinations such as sleep or sinus.  Heart Failure  Weigh yourself every morning when you first wake up and record on a calender or note pad, bring this to your office visits. Using a pill tender can help with taking your medications consistently.  Limit your fluid intake to 2 liters daily  Limit your sodium intake to less than 2-3 grams daily. Ask if you need dietary teaching.  If you gain more than 3 pounds (from your dry weight ), double your dose of diuretic for the day.  If you gain more than 5 pounds (from your dry weight), double your dose of lasix and call your heart failure doctor.  Please do not smoke tobacco since it is very bad for your heart.  Please do not drink alcohol since it can worsen your heart failure.Also avoid OTC nonsteroidal drugs, such as advil, aleve and motrin.  Try to exercise for at least 30 minutes every day because this will help your heart be more efficient. You may be eligible for supervised cardiac rehab, ask your physician.

## 2017-01-19 NOTE — Progress Notes (Signed)
Cardiology Office Note:    Date:  01/19/2017   ID:  Gloria Harrington, DOB May 26, 1945, MRN [id number]  PCP:  Gloria Flow, MD  Cardiologist:  Gloria More, MD    Referring MD: Gloria Flow, MD    ASSESSMENT:    1. Chronic systolic heart failure (HCC)   2. Persistent atrial fibrillation ([place])   3. Chronic anticoagulation   4. Non-rheumatic mitral regurgitation    PLAN:    In order of problems listed above:  1. Stable compensated no volume overload she request not to uptitrate her ARNI as she continues to her radiation therapy until her next visit.  She will recheck BMP on diuretic and BMP before next visit and continue beta-blocker. 2. Stable rate controlled continue higher dose of beta-blocker and anticoagulant 3. Stable continue anticoagulant 4. Clinically stable consider repeat echocardiogram about the time of her next visit for ejection fraction and quantitative valve area and effective regurgitant area   Next appointment: 3 months   Medication Adjustments/Labs and Tests Ordered: Current medicines are reviewed at length with the patient today.  Concerns regarding medicines are outlined above.  Orders Placed This Encounter  Procedures  . Basic metabolic panel  . Pro b natriuretic peptide (BNP)   Meds ordered this encounter  Medications  . furosemide (LASIX) 20 MG tablet    Sig: Take 1 tablet (20 mg total) by mouth daily. Take an extra tablet if you weigh 138 or greater    Dispense:  60 tablet    Refill:  3    Chief Complaint  Patient presents with  . Follow-up  . Congestive Heart Failure  . Atrial Fibrillation    History of Present Illness:    Gloria Harrington is a 72 y.o. female with a hx of hypertensive heart disease, Permanent Atrial Fibrillation with anticoagulation, Dyslipidemia, and hyperkalemia with ACEI   last seen 1 months ago [facility] with decompensated heart failure and EF 40% and moderate MR and initiated on ARNI at the office  visit.. Compliance with diet, lifestyle and medications: Yes Weight is stable approximately 135 pounds blood pressure target and despite radiation therapy she feels normal when she climbs stairs she no longer notices her heart beating or she short of breath she has no edema orthopnea no palpitations syncope TIA or bleeding.  Recent labs with her PCP shows her lipids to be in range and CMP is normal except for minimal elevation creatinine 1.09 better than last labs in my office mild elevation of bilirubin 1.5. Past Medical History:  Diagnosis Date  . 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 Chronic anticoagulation 08/14/2014  . Coronary artery calcification seen on CT scan 08/14/2014  . Essential hypertension 08/14/2014  . Hyperkalemia 03/24/2016  . Hyperlipidemia 08/14/2014  . Hypothyroid 09/17/2016  . PAF (paroxysmal atrial fibrillation) ([place]) 08/14/2014   Overview:  CHADS2 vasc score=4  . Palpitation 09/17/2016    Past Surgical History:  Procedure Laterality Date  . ARTHROPLASTY     for hammertoe  . BREAST BIOPSY    . HALLUX VALGUS CORRECTION    . open tenotomy of extensor second toe    . TUBAL LIGATION      Current Medications: Current Meds  Medication Sig  . calcium-vitamin D (OSCAL WITH D) 500-200 MG-UNIT tablet Take 1 tablet by mouth 2 (two) times daily.  Marland Kitchen ELIQUIS 5 MG TABS tablet Take 1 tablet (5 mg total) by mouth 2 (two) times  daily.  . furosemide (LASIX) 20 MG tablet Take 1 tablet (20 mg total) by mouth daily. Take an extra tablet if you weigh 138 or greater  . levothyroxine (SYNTHROID, LEVOTHROID) 75 MCG tablet Take 75 mcg by mouth daily.  . metoprolol tartrate (LOPRESSOR) 50 MG tablet Take 1 tablet (50 mg total) by mouth 2 (two) times daily.  . Multiple Vitamin (MULTI-VITAMINS) TABS Take 1 tablet by mouth daily.  . Omega-3 Fatty Acids (FISH OIL) 1000 MG CAPS Take 2 capsules by mouth 2 (two) times daily.  . pantoprazole (PROTONIX) 40 MG tablet TK 1 T PO D   . pravastatin (PRAVACHOL) 40 MG tablet Take 40 mg by mouth daily.  . sacubitril-valsartan (ENTRESTO) 24-26 MG Take 1 tablet by mouth 2 (two) times daily. Do not start until you are off lisinopril for 3 full days  . [DISCONTINUED] furosemide (LASIX) 20 MG tablet Take 1 tablet (20 mg total) by mouth 2 (two) times daily.     Allergies:   Clindamycin; Latex; Nitrofurantoin; Penicillins; Prednisone; and Sulfamethoxazole-trimethoprim   Social History   Socioeconomic History  . Marital status: Married    Spouse name: None  . Number of children: None  . Years of education: None  . Highest education level: None  Social Needs  . Financial resource strain: None  . Food insecurity - worry: None  . Food insecurity - inability: None  . Transportation needs - medical: None  . Transportation needs - non-medical: None  Occupational History  . None  Tobacco Use  . Smoking status: Never Smoker  . Smokeless tobacco: Never Used  Substance and Sexual Activity  . Alcohol use: No  . Drug use: No  . Sexual activity: None  Other Topics Concern  . None  Social History Narrative  . None     Family History: The patient's family history includes Breast cancer in her mother. ROS:   Please see the history of present illness.    All other systems reviewed and are negative.  EKGs/Labs/Other Studies Reviewed:    The following studies were reviewed today:   Recent Labs: CMP 12/31/16 normal  Cr 1.09k 3.9 12/29/2016: BUN 28; Creatinine, Ser 1.22; NT-Pro BNP 3,906; Potassium 3.8; Sodium 142  Recent Lipid Panel Chol 188, HDL 37 LDL 128 No results found for: CHOL, TRIG, HDL, CHOLHDL, VLDL, LDLCALC, LDLDIRECT  Physical Exam:    VS:  BP 130/74 (BP Location: Right Arm, Patient Position: Sitting, Cuff Size: Normal)   Pulse 66   Ht 5\' 5"  (1.651 m)   Wt 139 lb (63 kg)   SpO2 99%   BMI 23.13 kg/m     Wt Readings from Last 3 Encounters:  01/19/17 139 lb (63 kg)  12/29/16 139 lb (63 kg)  09/25/16  141 lb (64 kg)     GEN:  Well nourished, well developed in no acute distress HEENT: Normal NECK: No JVD; No carotid bruits LYMPHATICS: No lymphadenopathy CARDIAC: Irri Irr Variable s1  no murmurs, rubs, gallops RESPIRATORY:  Clear to auscultation without rales, wheezing or rhonchi  ABDOMEN: Soft, non-tender, non-distended MUSCULOSKELETAL:  No edema; No deformity  SKIN: Warm and dry NEUROLOGIC:  Alert and oriented x 3 PSYCHIATRIC:  Normal affect    Signed, Gloria More, MD  01/19/2017 3:48 PM    [facility]

## 2017-01-20 DIAGNOSIS — D0512 Intraductal carcinoma in situ of left breast: Secondary | ICD-10-CM | POA: Diagnosis not present

## 2017-01-20 DIAGNOSIS — Z510 Encounter for antineoplastic radiation therapy: Secondary | ICD-10-CM | POA: Diagnosis not present

## 2017-01-20 DIAGNOSIS — M8589 Other specified disorders of bone density and structure, multiple sites: Secondary | ICD-10-CM | POA: Diagnosis not present

## 2017-01-21 DIAGNOSIS — Z510 Encounter for antineoplastic radiation therapy: Secondary | ICD-10-CM | POA: Diagnosis not present

## 2017-01-21 DIAGNOSIS — D0512 Intraductal carcinoma in situ of left breast: Secondary | ICD-10-CM | POA: Diagnosis not present

## 2017-01-21 DIAGNOSIS — M8589 Other specified disorders of bone density and structure, multiple sites: Secondary | ICD-10-CM | POA: Diagnosis not present

## 2017-01-22 DIAGNOSIS — M8589 Other specified disorders of bone density and structure, multiple sites: Secondary | ICD-10-CM | POA: Diagnosis not present

## 2017-01-22 DIAGNOSIS — D0512 Intraductal carcinoma in situ of left breast: Secondary | ICD-10-CM | POA: Diagnosis not present

## 2017-01-22 DIAGNOSIS — Z510 Encounter for antineoplastic radiation therapy: Secondary | ICD-10-CM | POA: Diagnosis not present

## 2017-01-23 DIAGNOSIS — M8589 Other specified disorders of bone density and structure, multiple sites: Secondary | ICD-10-CM | POA: Diagnosis not present

## 2017-01-23 DIAGNOSIS — Z510 Encounter for antineoplastic radiation therapy: Secondary | ICD-10-CM | POA: Diagnosis not present

## 2017-01-23 DIAGNOSIS — R195 Other fecal abnormalities: Secondary | ICD-10-CM | POA: Diagnosis not present

## 2017-01-23 DIAGNOSIS — D0512 Intraductal carcinoma in situ of left breast: Secondary | ICD-10-CM | POA: Diagnosis not present

## 2017-01-26 DIAGNOSIS — Z510 Encounter for antineoplastic radiation therapy: Secondary | ICD-10-CM | POA: Diagnosis not present

## 2017-01-26 DIAGNOSIS — D0512 Intraductal carcinoma in situ of left breast: Secondary | ICD-10-CM | POA: Diagnosis not present

## 2017-01-26 DIAGNOSIS — M8589 Other specified disorders of bone density and structure, multiple sites: Secondary | ICD-10-CM | POA: Diagnosis not present

## 2017-01-27 DIAGNOSIS — M8589 Other specified disorders of bone density and structure, multiple sites: Secondary | ICD-10-CM | POA: Diagnosis not present

## 2017-01-27 DIAGNOSIS — D0512 Intraductal carcinoma in situ of left breast: Secondary | ICD-10-CM | POA: Diagnosis not present

## 2017-01-27 DIAGNOSIS — Z510 Encounter for antineoplastic radiation therapy: Secondary | ICD-10-CM | POA: Diagnosis not present

## 2017-01-28 ENCOUNTER — Telehealth (HOSPITAL_COMMUNITY): Payer: Self-pay | Admitting: *Deleted

## 2017-01-28 DIAGNOSIS — D0512 Intraductal carcinoma in situ of left breast: Secondary | ICD-10-CM | POA: Diagnosis not present

## 2017-01-28 DIAGNOSIS — Z510 Encounter for antineoplastic radiation therapy: Secondary | ICD-10-CM | POA: Diagnosis not present

## 2017-01-28 DIAGNOSIS — M8589 Other specified disorders of bone density and structure, multiple sites: Secondary | ICD-10-CM | POA: Diagnosis not present

## 2017-01-28 NOTE — Telephone Encounter (Signed)
error 

## 2017-01-28 NOTE — Telephone Encounter (Signed)
Patient given detailed instructions per Myocardial Perfusion Study Information Sheet for the test on  02/03/16. Patient notified to arrive 15 minutes early and that it is imperative to arrive on time for appointment to keep from having the test rescheduled.  If you need to cancel or reschedule your appointment, please call the office within 24 hours of your appointment. . Patient verbalized understanding. Gloria Harrington

## 2017-01-29 DIAGNOSIS — M8589 Other specified disorders of bone density and structure, multiple sites: Secondary | ICD-10-CM | POA: Diagnosis not present

## 2017-01-29 DIAGNOSIS — Z510 Encounter for antineoplastic radiation therapy: Secondary | ICD-10-CM | POA: Diagnosis not present

## 2017-01-29 DIAGNOSIS — D0512 Intraductal carcinoma in situ of left breast: Secondary | ICD-10-CM | POA: Diagnosis not present

## 2017-02-02 ENCOUNTER — Ambulatory Visit (HOSPITAL_COMMUNITY): Payer: Medicare Other | Attending: Cardiology

## 2017-02-02 DIAGNOSIS — I429 Cardiomyopathy, unspecified: Secondary | ICD-10-CM | POA: Diagnosis not present

## 2017-02-02 DIAGNOSIS — I493 Ventricular premature depolarization: Secondary | ICD-10-CM | POA: Insufficient documentation

## 2017-02-02 DIAGNOSIS — I4891 Unspecified atrial fibrillation: Secondary | ICD-10-CM | POA: Insufficient documentation

## 2017-02-02 LAB — MYOCARDIAL PERFUSION IMAGING
CHL CUP NUCLEAR SSS: 6
CHL CUP RESTING HR STRESS: 93 {beats}/min
Peak HR: 130 {beats}/min
RATE: 0.43
SDS: 3
SRS: 3
TID: 1

## 2017-02-02 MED ORDER — TECHNETIUM TC 99M TETROFOSMIN IV KIT
10.8000 | PACK | Freq: Once | INTRAVENOUS | Status: AC | PRN
  Administered 2017-02-02: 10.8 via INTRAVENOUS
  Filled 2017-02-02: qty 11

## 2017-02-02 MED ORDER — REGADENOSON 0.4 MG/5ML IV SOLN
0.4000 mg | Freq: Once | INTRAVENOUS | Status: AC
  Administered 2017-02-02: 0.4 mg via INTRAVENOUS

## 2017-02-02 MED ORDER — TECHNETIUM TC 99M TETROFOSMIN IV KIT
31.6000 | PACK | Freq: Once | INTRAVENOUS | Status: AC | PRN
  Administered 2017-02-02: 31.6 via INTRAVENOUS
  Filled 2017-02-02: qty 32

## 2017-02-04 DIAGNOSIS — R195 Other fecal abnormalities: Secondary | ICD-10-CM | POA: Diagnosis not present

## 2017-02-10 DIAGNOSIS — R195 Other fecal abnormalities: Secondary | ICD-10-CM | POA: Diagnosis not present

## 2017-02-10 DIAGNOSIS — R1030 Lower abdominal pain, unspecified: Secondary | ICD-10-CM | POA: Diagnosis not present

## 2017-02-25 DIAGNOSIS — M8580 Other specified disorders of bone density and structure, unspecified site: Secondary | ICD-10-CM | POA: Diagnosis not present

## 2017-02-25 DIAGNOSIS — Z86000 Personal history of in-situ neoplasm of breast: Secondary | ICD-10-CM | POA: Diagnosis not present

## 2017-02-25 DIAGNOSIS — R945 Abnormal results of liver function studies: Secondary | ICD-10-CM | POA: Diagnosis not present

## 2017-02-25 DIAGNOSIS — M8589 Other specified disorders of bone density and structure, multiple sites: Secondary | ICD-10-CM | POA: Diagnosis not present

## 2017-02-25 DIAGNOSIS — Z803 Family history of malignant neoplasm of breast: Secondary | ICD-10-CM | POA: Diagnosis not present

## 2017-02-25 DIAGNOSIS — D0512 Intraductal carcinoma in situ of left breast: Secondary | ICD-10-CM | POA: Diagnosis not present

## 2017-02-25 DIAGNOSIS — R748 Abnormal levels of other serum enzymes: Secondary | ICD-10-CM | POA: Diagnosis not present

## 2017-03-13 DIAGNOSIS — I5022 Chronic systolic (congestive) heart failure: Secondary | ICD-10-CM | POA: Diagnosis not present

## 2017-03-17 DIAGNOSIS — D0512 Intraductal carcinoma in situ of left breast: Secondary | ICD-10-CM | POA: Diagnosis not present

## 2017-03-17 DIAGNOSIS — Z6823 Body mass index (BMI) 23.0-23.9, adult: Secondary | ICD-10-CM | POA: Diagnosis not present

## 2017-03-17 DIAGNOSIS — M1811 Unilateral primary osteoarthritis of first carpometacarpal joint, right hand: Secondary | ICD-10-CM | POA: Diagnosis not present

## 2017-03-17 DIAGNOSIS — B078 Other viral warts: Secondary | ICD-10-CM | POA: Diagnosis not present

## 2017-03-18 DIAGNOSIS — D0512 Intraductal carcinoma in situ of left breast: Secondary | ICD-10-CM | POA: Diagnosis not present

## 2017-03-20 DIAGNOSIS — M1811 Unilateral primary osteoarthritis of first carpometacarpal joint, right hand: Secondary | ICD-10-CM | POA: Diagnosis not present

## 2017-04-10 DIAGNOSIS — R195 Other fecal abnormalities: Secondary | ICD-10-CM | POA: Diagnosis not present

## 2017-04-16 DIAGNOSIS — Z8719 Personal history of other diseases of the digestive system: Secondary | ICD-10-CM | POA: Diagnosis not present

## 2017-04-16 DIAGNOSIS — R195 Other fecal abnormalities: Secondary | ICD-10-CM | POA: Diagnosis not present

## 2017-04-21 DIAGNOSIS — I340 Nonrheumatic mitral (valve) insufficiency: Secondary | ICD-10-CM

## 2017-04-21 HISTORY — DX: Nonrheumatic mitral (valve) insufficiency: I34.0

## 2017-04-21 NOTE — Progress Notes (Signed)
Cardiology Office Note:    Date:  04/22/2017   ID:  Gloria Harrington, DOB 05/04/1945, MRN [id number]  PCP:  Mateo Flow, MD  Cardiologist:  Shirlee More, MD    Referring MD: Mateo Flow, MD    ASSESSMENT:    1. Chronic systolic heart failure ([place])   2. Hypertensive heart disease with chronic systolic congestive heart failure ([place])   3. Non-rheumatic mitral regurgitation    PLAN:    In order of problems listed above:  1. Stable compensated she has no edema New York Heart Association class I.  She will continue current medical treatment including loop diuretic beta-blocker ARNI and recheck ejection fraction and severity of mitral regurgitation.  Her ejection fraction remains less than 40 we will up titrate her ARNI 2. Stable continue current treatment 3. Recheck severity of mitral regurgitation if severe would benefit from mitral valve intervention 4. Atrial fibrillation stable rate controlled continue anticoagulant 5. Hyperlipidemia stable continue statin   Next appointment: 3 months   Medication Adjustments/Labs and Tests Ordered: Current medicines are reviewed at length with the patient today.  Concerns regarding medicines are outlined above.  No orders of the defined types were placed in this encounter.  No orders of the defined types were placed in this encounter.   Chief Complaint  Patient presents with  . Follow-up    3 month flup appt     History of Present Illness:    Gloria Harrington is a 72 y.o. female with a hx of hypertensive heart disease with heart failure EF 40%,PermanentAtrial Fibrillation with anticoagulation, Dyslipidemia, and hyperkalemia with ACEI last seen 01/19/17.  ASSESSMENT:  01/29/17   1. Chronic systolic heart failure (HCC)   2. Persistent atrial fibrillation ([place])   3. Chronic anticoagulation   4. Non-rheumatic mitral regurgitation    PLAN:    In order of problems listed above:  6. Stable compensated no volume overload  she request not to uptitrate her ARNI as she continues to her radiation therapy until her next visit.  She will recheck BMP on diuretic and BMP before next visit and continue beta-blocker. 7. Stable rate controlled continue higher dose of beta-blocker and anticoagulant 8. Stable continue anticoagulant 9. Clinically stable consider repeat echocardiogram about the time of her next visit for ejection fraction and quantitative valve area and effective regurgitant area  Compliance with diet, lifestyle and medications: Yes She is pleased with the quality of her life [facility]  No shortness of breath edema orthopnea chest pain palpitation or syncope recent labs performed at her PCP office reviewed with the patient during the visit.  Her BNP level was 770 renal function was normal creatinine 1.1 potassium 4.4 cholesterol 188 HDL 37 LDL 128 Past Medical History:  Diagnosis Date  . 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 Chronic anticoagulation 08/14/2014  . Coronary artery calcification seen on CT scan 08/14/2014  . Essential hypertension 08/14/2014  . Hyperkalemia 03/24/2016  . Hyperlipidemia 08/14/2014  . Hypothyroid 09/17/2016  . PAF (paroxysmal atrial fibrillation) ([place]) 08/14/2014   Overview:  CHADS2 vasc score=4  . Palpitation 09/17/2016    Past Surgical History:  Procedure Laterality Date  . ARTHROPLASTY     for hammertoe  . BREAST BIOPSY    . HALLUX VALGUS CORRECTION    . open tenotomy of extensor second toe    . TUBAL LIGATION      Current Medications: Current Meds  Medication Sig  . calcium-vitamin D (OSCAL WITH D) 500-200 MG-UNIT tablet Take 1 tablet by mouth 2 (two) times daily.  Marland Kitchen ELIQUIS 5 MG TABS tablet Take 1 tablet (5 mg total) by mouth 2 (two) times daily.  . furosemide (LASIX) 20 MG tablet Take 1 tablet (20 mg total) by mouth daily. Take an extra tablet if you weigh 138 or greater  . levothyroxine (SYNTHROID, LEVOTHROID) 75 MCG  tablet Take 75 mcg by mouth daily.  . metoprolol tartrate (LOPRESSOR) 50 MG tablet Take 1 tablet (50 mg total) by mouth 2 (two) times daily.  . Multiple Vitamin (MULTI-VITAMINS) TABS Take 1 tablet by mouth daily.  . Omega-3 Fatty Acids (FISH OIL) 1000 MG CAPS Take 2 capsules by mouth 2 (two) times daily.  . pantoprazole (PROTONIX) 40 MG tablet TK 1 T PO D  . pravastatin (PRAVACHOL) 40 MG tablet Take 40 mg by mouth daily.  . sacubitril-valsartan (ENTRESTO) 24-26 MG Take 1 tablet by mouth 2 (two) times daily. Do not start until you are off lisinopril for 3 full days     Allergies:   Clindamycin; Latex; Nitrofurantoin; Penicillins; Prednisone; and Sulfamethoxazole-trimethoprim   Social History   Socioeconomic History  . Marital status: Married    Spouse name: Not on file  . Number of children: Not on file  . Years of education: Not on file  . Highest education level: Not on file  Occupational History  . Not on file  Social Needs  . Financial resource strain: Not on file  . Food insecurity:    Worry: Not on file    Inability: Not on file  . Transportation needs:    Medical: Not on file    Non-medical: Not on file  Tobacco Use  . Smoking status: Never Smoker  . Smokeless tobacco: Never Used  Substance and Sexual Activity  . Alcohol use: No  . Drug use: No  . Sexual activity: Not on file  Lifestyle  . Physical activity:    Days per week: Not on file    Minutes per session: Not on file  . Stress: Not on file  Relationships  . Social connections:    Talks on phone: Not on file    Gets together: Not on file    Attends religious service: Not on file    Active member of club or organization: Not on file    Attends meetings of clubs or organizations: Not on file    Relationship status: Not on file  Other Topics Concern  . Not on file  Social History Narrative  . Not on file     Family History: The patient's family history includes Breast cancer in her sister and sister;  Hyperlipidemia in her sister and sister. ROS:   Please see the history of present illness.    All other systems reviewed and are negative.  EKGs/Labs/Other Studies Reviewed:    The following studies were reviewed today MPI 02/02/17: Gloria Harrington  GATED SPECT [facility] PERF [facility] STRESS 1D  Order# [id number]  Reading physician: Sueanne Margarita, MD Ordering physician: Richardo Priest, MD Study date: 02/02/17  Patient Information   Name MRN Description  Gloria Harrington [id number] 72 y.o. Female  Result Notes for Myocardial Perfusion Imaging   Notes recorded by Jossie Ng, RN on 02/02/2017 at 2:50 PM EST Patient informed of results.  ------  Notes recorded by Richardo Priest, MD on 02/02/2017 at 2:40 PM EST Normal myoview  Vitals   Height Weight BMI (Calculated)  5\' 5"  (1.651 m) 139 lb (63 kg) 23.13  Study Highlights   The nuclear perfusion study is normal.  Gated images not obtained due to atrial fibrillation and PVCs  There was no ST segment deviation noted during stress. Frequent PVCs were noted including bigeminal PVCs  This is a low risk study.    Recent Labs: 12/29/2016: BUN 28; Creatinine, Ser 1.22; NT-Pro BNP 3,906; Potassium 3.8; Sodium 142  Recent Lipid Panel No results found for: CHOL, TRIG, HDL, CHOLHDL, VLDL, LDLCALC, LDLDIRECT  Physical Exam:    VS:  BP 118/66 (BP Location: Left Arm, Patient Position: Sitting, Cuff Size: Normal)   Pulse (!) 56   Ht 5\' 5"  (1.651 m)   Wt 135 lb 12.8 oz (61.6 kg)   SpO2 99%   BMI 22.60 kg/m     Wt Readings from Last 3 Encounters:  04/22/17 135 lb 12.8 oz (61.6 kg)  01/19/17 139 lb (63 kg)  12/29/16 139 lb (63 kg)     GEN:  Well nourished, well developed in no acute distress HEENT: Normal NECK: No JVD; No carotid bruits LYMPHATICS: No lymphadenopathy CARDIAC: 2/6 MR no S3 S1 variable RESPIRATORY:  Clear to auscultation without rales, wheezing or rhonchi  ABDOMEN: Soft, non-tender,  non-distended MUSCULOSKELETAL:  No edema; No deformity  SKIN: Warm and dry NEUROLOGIC:  Alert and oriented x 3 PSYCHIATRIC:  Normal affect    Signed, Shirlee More, MD  04/22/2017 10:10 AM    [facility]

## 2017-04-22 ENCOUNTER — Encounter: Payer: Self-pay | Admitting: Cardiology

## 2017-04-22 ENCOUNTER — Ambulatory Visit: Payer: Medicare Other | Admitting: Cardiology

## 2017-04-22 VITALS — BP 118/66 | HR 56 | Ht 65.0 in | Wt 135.8 lb

## 2017-04-22 DIAGNOSIS — I5022 Chronic systolic (congestive) heart failure: Secondary | ICD-10-CM

## 2017-04-22 DIAGNOSIS — I110 Hypertensive heart disease with heart failure: Secondary | ICD-10-CM

## 2017-04-22 DIAGNOSIS — I340 Nonrheumatic mitral (valve) insufficiency: Secondary | ICD-10-CM | POA: Diagnosis not present

## 2017-04-22 NOTE — Patient Instructions (Signed)
Medication Instructions:  Your physician recommends that you continue on your current medications as directed. Please refer to the Current Medication list given to you today.   Labwork: None  Testing/Procedures: Your physician has requested that you have an echocardiogram. Echocardiography is a painless test that uses sound waves to create images of your heart. It provides your doctor with information about the size and shape of your heart and how well your heart's chambers and valves are working. This procedure takes approximately one hour. There are no restrictions for this procedure.  Follow-Up: Your physician wants you to follow-up in: 3 months. You will receive a reminder letter in the mail two months in advance. If you don't receive a letter, please call our office to schedule the follow-up appointment.  Any Other Special Instructions Will Be Listed Below (If Applicable).     If you need a refill on your cardiac medications before your next appointment, please call your pharmacy.   

## 2017-05-11 ENCOUNTER — Telehealth: Payer: Self-pay

## 2017-05-11 MED ORDER — SACUBITRIL-VALSARTAN 24-26 MG PO TABS: 1.0000 | ORAL_TABLET | Freq: Two times a day (BID) | ORAL | 11 refills | Status: DC

## 2017-05-11 NOTE — Telephone Encounter (Signed)
Rx for Entresto 24-26mg  one tablet BID sent to [facility] per fax request.

## 2017-05-18 ENCOUNTER — Other Ambulatory Visit (HOSPITAL_BASED_OUTPATIENT_CLINIC_OR_DEPARTMENT_OTHER): Payer: Medicare Other

## 2017-05-26 DIAGNOSIS — Z86000 Personal history of in-situ neoplasm of breast: Secondary | ICD-10-CM | POA: Diagnosis not present

## 2017-05-27 ENCOUNTER — Telehealth: Payer: Self-pay | Admitting: Cardiology

## 2017-05-27 ENCOUNTER — Ambulatory Visit (HOSPITAL_BASED_OUTPATIENT_CLINIC_OR_DEPARTMENT_OTHER)
Admission: RE | Admit: 2017-05-27 | Discharge: 2017-05-27 | Disposition: A | Discharge status: Home / Self Care | Payer: Medicare Other | Source: Ambulatory Visit | Attending: Cardiology | Admitting: Cardiology

## 2017-05-27 DIAGNOSIS — E785 Hyperlipidemia, unspecified: Secondary | ICD-10-CM | POA: Insufficient documentation

## 2017-05-27 DIAGNOSIS — I083 Combined rheumatic disorders of mitral, aortic and tricuspid valves: Secondary | ICD-10-CM | POA: Diagnosis not present

## 2017-05-27 DIAGNOSIS — I429 Cardiomyopathy, unspecified: Secondary | ICD-10-CM | POA: Insufficient documentation

## 2017-05-27 DIAGNOSIS — R002 Palpitations: Secondary | ICD-10-CM | POA: Insufficient documentation

## 2017-05-27 DIAGNOSIS — I5022 Chronic systolic (congestive) heart failure: Secondary | ICD-10-CM

## 2017-05-27 MED ORDER — PRAVASTATIN SODIUM 40 MG PO TABS: 40.0000 mg | ORAL_TABLET | Freq: Every day | ORAL | 3 refills | Status: DC

## 2017-05-27 MED ORDER — PERFLUTREN LIPID MICROSPHERE
1.0000 mL | INTRAVENOUS | Status: AC | PRN
Start: 2017-05-27 — End: 2017-05-27
  Administered 2017-05-27: 4 mL via INTRAVENOUS
  Filled 2017-05-27: qty 10

## 2017-05-27 NOTE — Telephone Encounter (Signed)
Refill sent. Left message on home number per DPR advising that Dr. Bettina Gavia does not recommend over the counter sleep medications. Advised that patient should consult primary care physician if she needs a prescription for sleep medication as Dr. Bettina Gavia does not prescribe those either. Advised to return call with any further questions or concerns.

## 2017-05-27 NOTE — Progress Notes (Addendum)
Echocardiogram 2D Echocardiogram with contrast has been performed.  Gloria Harrington 05/27/2017, 10:13 AM

## 2017-05-27 NOTE — Telephone Encounter (Signed)
Patient would like a refill on:  *STAT* If patient is at the pharmacy, call can be transferred to refill team.   1. Which medications need to be refilled? (please list name of each medication and dose if known) Pravastatin 40mg    2. Which pharmacy/location (including street and city if local pharmacy) is medication to be sent to?[facility]  3. Do they need a 30 day or 90 day supply? 45  ALSO she wants to see if it is ok for her to take the Simply Sleep by Tylenol.. Please call her.

## 2017-06-23 ENCOUNTER — Telehealth: Payer: Self-pay | Admitting: Cardiology

## 2017-06-23 MED ORDER — SACUBITRIL-VALSARTAN 24-26 MG PO TABS: 1.0000 | ORAL_TABLET | Freq: Two times a day (BID) | ORAL | 2 refills | Status: DC

## 2017-06-23 MED ORDER — ELIQUIS 5 MG PO TABS: 5.0000 mg | ORAL_TABLET | Freq: Two times a day (BID) | ORAL | 2 refills | Status: DC

## 2017-06-23 NOTE — Telephone Encounter (Signed)
Left message to return call 

## 2017-06-23 NOTE — Telephone Encounter (Signed)
Prescriptions sent to [facility] per patient request.

## 2017-06-23 NOTE — Telephone Encounter (Signed)
Has questions about getting her Eliquis and Entresto cheaper

## 2017-07-07 ENCOUNTER — Other Ambulatory Visit: Payer: Self-pay

## 2017-07-07 ENCOUNTER — Telehealth: Payer: Self-pay | Admitting: Cardiology

## 2017-07-07 MED ORDER — ELIQUIS 5 MG PO TABS: 5.0000 mg | ORAL_TABLET | Freq: Two times a day (BID) | ORAL | 3 refills | Status: DC

## 2017-07-07 NOTE — Telephone Encounter (Signed)
Wants someone to call her about assistance program for eliquis

## 2017-07-07 NOTE — Telephone Encounter (Signed)
Advised patient that paperwork was faxed back this morning. Patient verbalized understanding. No further questions.

## 2017-07-17 ENCOUNTER — Telehealth: Payer: Self-pay | Admitting: Cardiology

## 2017-07-17 NOTE — Telephone Encounter (Signed)
Wants samples of eliquis °

## 2017-07-17 NOTE — Telephone Encounter (Signed)
Patient notified that we have samples of Eliquis 5mg  held for patient.  Patient verbalized understanding.

## 2017-07-21 NOTE — Progress Notes (Signed)
Cardiology Office Note:    Date:  07/22/2017   ID:  Gloria Harrington, DOB [birth year], MRN [id number]  PCP:  Mateo Flow, MD  Cardiologist:  Shirlee More, MD    Referring MD: Mateo Flow, MD    ASSESSMENT:    1. Chronic combined systolic and diastolic heart failure ([place])   2. Hypertensive heart disease with chronic systolic congestive heart failure ([place])   3. Chronic atrial fibrillation ([place])   4. Chronic anticoagulation   5. Hyperlipidemia, unspecified hyperlipidemia type   6. Non-rheumatic mitral regurgitation    PLAN:    In order of problems listed above:  1. Improved objectively and symptomatically New York Heart Association class I-II she will uptitrate her ARNI today follow-up in 3 months and consider repeat echocardiogram about that time she is due for labs later this month I will ask her PCP to also check a proBNP level and renal function 2. Stable heart failure is nicely compensated blood pressure target of uptitrate her ARNI. 3. Stable rate controlled, she will continue her beta-blocker and anticoagulant 4. Stable continue her anticoagulant 5. Stable continue lipid-lowering therapy, she will have a wellness exam the end of the month with lipids checked 6. Improved with improvement of heart failure which is generally the course with functional mitral regurgitation.  She does not need any cardiac valvular intervention   Next appointment: 3 months   Medication Adjustments/Labs and Tests Ordered: Current medicines are reviewed at length with the patient today.  Concerns regarding medicines are outlined above.  No orders of the defined types were placed in this encounter.  Meds ordered this encounter  Medications  . DISCONTD: sacubitril-valsartan (ENTRESTO) 49-51 mg per tablet    Order Specific Question:   ACE-inhibitors have NOT been administered in the past 36-hours.    Answer:   YES (confirmed by ordering provider)  . sacubitril-valsartan (ENTRESTO) 49-51 MG   Sig: Take 1 tablet by mouth 2 (two) times daily.    Dispense:  60 tablet    Refill:  11    Chief Complaint  Patient presents with  . Follow-up    History of Present Illness:    Gloria Harrington is a 72 y.o. female with a hx of  hypertensive heart disease with heart failure EF 40%, Permanent Atrial Fibrillation with anticoagulation, Dyslipidemia, and hyperkalemia with ACEI , last seen 04/22/17. Compliance with diet, lifestyle and medications: Yes  She has made a nice recovery and is New York Heart Association class II.  [facility] [employer] [facility] but is never been able to get back to her previous level and notices mild exertional shortness of breath.  No edema orthopnea palpitations syncope or TIA she tolerates her medications without hypotension.  She will uptitrate her ARNI today and she has had a significant increase in ejection fraction and decrease in her functional mitral regurgitation. Past Medical History:  Diagnosis Date  . 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 Chronic anticoagulation 08/14/2014  . Coronary artery calcification seen on CT scan 08/14/2014  . Essential hypertension 08/14/2014  . Hyperkalemia 03/24/2016  . Hyperlipidemia 08/14/2014  . Hypothyroid 09/17/2016  . PAF (paroxysmal atrial fibrillation) ([place]) 08/14/2014   Overview:  CHADS2 vasc score=4  . Palpitation 09/17/2016    Past Surgical History:  Procedure Laterality Date  . ARTHROPLASTY     for hammertoe  . BREAST BIOPSY    . HALLUX VALGUS CORRECTION    . open tenotomy  of extensor second toe    . TUBAL LIGATION      Current Medications: Current Meds  Medication Sig  . calcium-vitamin D (OSCAL WITH D) 500-200 MG-UNIT tablet Take 1 tablet by mouth 2 (two) times daily.  Marland Kitchen ELIQUIS 5 MG TABS tablet Take 1 tablet (5 mg total) by mouth 2 (two) times daily.  . furosemide (LASIX) 20 MG tablet Take 1 tablet (20 mg total) by mouth daily. Take an extra tablet if you weigh 138 or greater  .  levothyroxine (SYNTHROID, LEVOTHROID) 75 MCG tablet Take 75 mcg by mouth daily.  . metoprolol tartrate (LOPRESSOR) 50 MG tablet Take 1 tablet (50 mg total) by mouth 2 (two) times daily.  . Multiple Vitamin (MULTI-VITAMINS) TABS Take 1 tablet by mouth daily.  . Omega-3 Fatty Acids (FISH OIL) 1000 MG CAPS Take 2 capsules by mouth 2 (two) times daily.  . pantoprazole (PROTONIX) 40 MG tablet TK 1 T PO D  . pravastatin (PRAVACHOL) 40 MG tablet Take 1 tablet (40 mg total) by mouth daily.  . [DISCONTINUED] sacubitril-valsartan (ENTRESTO) 24-26 MG Take 1 tablet by mouth 2 (two) times daily.     Allergies:   Sulfamethoxazole-trimethoprim; Clindamycin; Nitrofurantoin; Penicillins; Prednisone; and Latex   Social History   Socioeconomic History  . Marital status: Married    Spouse name: Not on file  . Number of children: Not on file  . Years of education: Not on file  . Highest education level: Not on file  Occupational History  . Not on file  Social Needs  . Financial resource strain: Not on file  . Food insecurity:    Worry: Not on file    Inability: Not on file  . Transportation needs:    Medical: Not on file    Non-medical: Not on file  Tobacco Use  . Smoking status: Never Smoker  . Smokeless tobacco: Never Used  Substance and Sexual Activity  . Alcohol use: No  . Drug use: No  . Sexual activity: Not on file  Lifestyle  . Physical activity:    Days per week: Not on file    Minutes per session: Not on file  . Stress: Not on file  Relationships  . Social connections:    Talks on phone: Not on file    Gets together: Not on file    Attends religious service: Not on file    Active member of club or organization: Not on file    Attends meetings of clubs or organizations: Not on file    Relationship status: Not on file  Other Topics Concern  . Not on file  Social History Narrative  . Not on file     Family History: The patient's family history includes Breast cancer in her  sister and sister; Hyperlipidemia in her sister and sister. ROS:   Please see the history of present illness.    All other systems reviewed and are negative.  EKGs/Labs/Other Studies Reviewed:    The following studies were reviewed today:  Echocardiogram 05/27/2017 shows improved function ejection fraction 45 to 50% mild aortic regurgitation mild to moderate mitral regurgitation moderate tricuspid regurgitation Recent Labs: 12/29/2016: BUN 28; Creatinine, Ser 1.22; NT-Pro BNP 3,906; Potassium 3.8; Sodium 142  Recent Lipid Panel No results found for: CHOL, TRIG, HDL, CHOLHDL, VLDL, LDLCALC, LDLDIRECT  Physical Exam:    VS:  BP 118/70 (BP Location: Left Arm, Patient Position: Sitting, Cuff Size: Normal)   Pulse 74   Ht 5\' 5"  (1.651 m)  Wt 137 lb 6.4 oz (62.3 kg)   SpO2 98%   BMI 22.86 kg/m     Wt Readings from Last 3 Encounters:  07/22/17 137 lb 6.4 oz (62.3 kg)  04/22/17 135 lb 12.8 oz (61.6 kg)  01/19/17 139 lb (63 kg)     GEN:  Well nourished, well developed in no acute distress HEENT: Normal NECK: No JVD; No carotid bruits LYMPHATICS: No lymphadenopathy CARDIAC: Irregular regular 1/6 murmur of mitral regurgitation  RESPIRATORY:  Clear to auscultation without rales, wheezing or rhonchi  ABDOMEN: Soft, non-tender, non-distended MUSCULOSKELETAL:  No edema; No deformity  SKIN: Warm and dry NEUROLOGIC:  Alert and oriented x 3 PSYCHIATRIC:  Normal affect    Signed, Shirlee More, MD  07/22/2017 12:09 PM    [place]

## 2017-07-22 ENCOUNTER — Encounter: Payer: Self-pay | Admitting: Cardiology

## 2017-07-22 ENCOUNTER — Ambulatory Visit: Payer: Medicare Other | Admitting: Cardiology

## 2017-07-22 VITALS — BP 118/70 | HR 74 | Ht 65.0 in | Wt 137.4 lb

## 2017-07-22 DIAGNOSIS — I5022 Chronic systolic (congestive) heart failure: Secondary | ICD-10-CM | POA: Diagnosis not present

## 2017-07-22 DIAGNOSIS — I110 Hypertensive heart disease with heart failure: Secondary | ICD-10-CM

## 2017-07-22 DIAGNOSIS — E785 Hyperlipidemia, unspecified: Secondary | ICD-10-CM

## 2017-07-22 DIAGNOSIS — Z7901 Long term (current) use of anticoagulants: Secondary | ICD-10-CM

## 2017-07-22 DIAGNOSIS — I340 Nonrheumatic mitral (valve) insufficiency: Secondary | ICD-10-CM

## 2017-07-22 DIAGNOSIS — I482 Chronic atrial fibrillation, unspecified: Secondary | ICD-10-CM

## 2017-07-22 DIAGNOSIS — I5042 Chronic combined systolic (congestive) and diastolic (congestive) heart failure: Secondary | ICD-10-CM

## 2017-07-22 MED ORDER — SACUBITRIL-VALSARTAN 49-51 MG PO TABS: 1.0000 | ORAL_TABLET | Freq: Two times a day (BID) | ORAL | 11 refills | Status: DC

## 2017-07-22 MED ORDER — SACUBITRIL-VALSARTAN 49-51 MG PO TABS: 1.0000 | ORAL_TABLET | Freq: Two times a day (BID) | ORAL | Status: DC

## 2017-07-22 NOTE — Patient Instructions (Signed)
Medication Instructions:  Your physician has recommended you make the following change in your medication:   INCREASE: Entresto to 49-51 mg one tablet twice daily  Labwork: NONE  Testing/Procedures: NONE  Follow-Up: Your physician wants you to follow-up in: 3 months. You will receive a reminder letter in the mail two months in advance. If you don't receive a letter, please call our office to schedule the follow-up appointment.   Any Other Special Instructions Will Be Listed Below (If Applicable).     If you need a refill on your cardiac medications before your next appointment, please call your pharmacy.

## 2017-07-22 NOTE — Telephone Encounter (Signed)
Informed patient's husband, Sid, per DPR that patient has been approved for patient assistance with eliquis. Sid states that patient is aware. No further questions.

## 2017-08-10 ENCOUNTER — Telehealth: Payer: Self-pay | Admitting: Cardiology

## 2017-08-10 ENCOUNTER — Other Ambulatory Visit: Payer: Self-pay

## 2017-08-10 MED ORDER — METOPROLOL TARTRATE 50 MG PO TABS: 50.0000 mg | ORAL_TABLET | Freq: Two times a day (BID) | ORAL | 2 refills | Status: DC

## 2017-08-10 NOTE — Telephone Encounter (Signed)
°*  STAT* If patient is at the pharmacy, call can be transferred to refill team.   1. Which medications need to be refilled? (please list name of each medication and dose if known) Metorpolol Titrate 50mg  takes twice daily   2. Which pharmacy/location (including street and city if local pharmacy) is medication to be sent to? [facility] in [place]   3. Do they need a 30 day or 90 day supply? [place]

## 2017-08-10 NOTE — Telephone Encounter (Signed)
Med refill has been sent. 

## 2017-08-11 DIAGNOSIS — I4891 Unspecified atrial fibrillation: Secondary | ICD-10-CM | POA: Diagnosis not present

## 2017-08-11 DIAGNOSIS — E039 Hypothyroidism, unspecified: Secondary | ICD-10-CM | POA: Diagnosis not present

## 2017-08-11 DIAGNOSIS — K279 Peptic ulcer, site unspecified, unspecified as acute or chronic, without hemorrhage or perforation: Secondary | ICD-10-CM | POA: Diagnosis not present

## 2017-08-11 DIAGNOSIS — Z79899 Other long term (current) drug therapy: Secondary | ICD-10-CM | POA: Diagnosis not present

## 2017-08-11 DIAGNOSIS — I509 Heart failure, unspecified: Secondary | ICD-10-CM | POA: Diagnosis not present

## 2017-08-11 DIAGNOSIS — E782 Mixed hyperlipidemia: Secondary | ICD-10-CM | POA: Diagnosis not present

## 2017-08-11 DIAGNOSIS — Z0000 Encounter for general adult medical examination without abnormal findings: Secondary | ICD-10-CM | POA: Diagnosis not present

## 2017-08-17 ENCOUNTER — Telehealth: Payer: Self-pay | Admitting: Cardiology

## 2017-08-17 NOTE — Telephone Encounter (Signed)
PCP did lab work and faxed to [place] and the PCP wants to change the cholesterol medicine and the PCP told patient to call us to see what [facility] thinks??

## 2017-08-17 NOTE — Telephone Encounter (Signed)
Requested the records from PCP.

## 2017-08-24 NOTE — Telephone Encounter (Signed)
Will have entresto samples awaiting for patient, patient assistance forms were signed today and have been faxed. Requested labs and office note from PCP.

## 2017-08-25 DIAGNOSIS — H2513 Age-related nuclear cataract, bilateral: Secondary | ICD-10-CM | POA: Diagnosis not present

## 2017-08-25 DIAGNOSIS — H40003 Preglaucoma, unspecified, bilateral: Secondary | ICD-10-CM | POA: Diagnosis not present

## 2017-08-25 NOTE — Telephone Encounter (Signed)
Left voicemail informing patient that [facility] ([place]) did review over her labs and was okay with whatever changes her PCP wanted to make with cholesterol medications.

## 2017-08-28 DIAGNOSIS — M1811 Unilateral primary osteoarthritis of first carpometacarpal joint, right hand: Secondary | ICD-10-CM | POA: Insufficient documentation

## 2017-08-28 HISTORY — DX: Unilateral primary osteoarthritis of first carpometacarpal joint, right hand: M18.11

## 2017-09-02 ENCOUNTER — Telehealth: Payer: Self-pay

## 2017-09-02 DIAGNOSIS — M25541 Pain in joints of right hand: Secondary | ICD-10-CM | POA: Diagnosis not present

## 2017-09-02 DIAGNOSIS — M1811 Unilateral primary osteoarthritis of first carpometacarpal joint, right hand: Secondary | ICD-10-CM | POA: Diagnosis not present

## 2017-09-02 DIAGNOSIS — I5022 Chronic systolic (congestive) heart failure: Secondary | ICD-10-CM

## 2017-09-02 MED ORDER — FUROSEMIDE 20 MG PO TABS: 20.0000 mg | ORAL_TABLET | Freq: Every day | ORAL | 3 refills | Status: DC

## 2017-09-02 NOTE — Telephone Encounter (Signed)
Rx sent to pharmacy   

## 2017-09-03 DIAGNOSIS — Z86000 Personal history of in-situ neoplasm of breast: Secondary | ICD-10-CM | POA: Diagnosis not present

## 2017-09-07 ENCOUNTER — Other Ambulatory Visit: Payer: Self-pay

## 2017-09-07 MED ORDER — SACUBITRIL-VALSARTAN 49-51 MG PO TABS: 1.0000 | ORAL_TABLET | Freq: Two times a day (BID) | ORAL | 3 refills | Status: DC

## 2017-09-17 ENCOUNTER — Other Ambulatory Visit: Payer: Self-pay

## 2017-09-17 MED ORDER — SACUBITRIL-VALSARTAN 49-51 MG PO TABS: 1.0000 | ORAL_TABLET | Freq: Two times a day (BID) | ORAL | 3 refills | Status: DC

## 2017-09-17 NOTE — Telephone Encounter (Signed)
Printed Rx for [employer] patient assistance.

## 2017-09-23 DIAGNOSIS — D0512 Intraductal carcinoma in situ of left breast: Secondary | ICD-10-CM | POA: Diagnosis not present

## 2017-09-23 DIAGNOSIS — R922 Inconclusive mammogram: Secondary | ICD-10-CM | POA: Diagnosis not present

## 2017-10-06 DIAGNOSIS — D0512 Intraductal carcinoma in situ of left breast: Secondary | ICD-10-CM | POA: Diagnosis not present

## 2017-10-06 DIAGNOSIS — Z6823 Body mass index (BMI) 23.0-23.9, adult: Secondary | ICD-10-CM | POA: Diagnosis not present

## 2017-10-31 NOTE — Progress Notes (Signed)
Cardiology Office Note:    Date:  11/02/2017   ID:  Gloria Harrington, DOB 05/15/1945, MRN [id number]  PCP:  Mateo Flow, MD  Cardiologist:  Shirlee More, MD    Referring MD: Mateo Flow, MD    ASSESSMENT:    1. Chronic atrial fibrillation   2. Chronic combined systolic and diastolic heart failure ([place])   3. Hypertensive heart disease with chronic systolic congestive heart failure ([place])   4. Nonrheumatic mitral valve regurgitation   5. Chronic anticoagulation   6. Hyperlipidemia, unspecified hyperlipidemia type    PLAN:    In order of problems listed above:  1. Her atrial fibrillation is stable rate controlled continue beta-blocker and anticoagulant 2. Heart failure is compensated she has no fluid overload New York Heart Association class I.  I advocated increasing her ARNI dose she declines we will plan on rechecking echocardiogram in 1 year and continue current therapy 3. Stable blood pressure continue current treatment mildly changes to ask her to fully sodium restrict. 4. Stable mitral regurgitation improved with guideline directed therapy for heart failure I do not think she would benefit benefit from valvular intervention, it is secondary 5. Continue her current anticoagulant no bleeding complication 6. Poorly controlled her LDL remains markedly elevated we discussed additional lipid-lowering therapy either Zetia or PCSK9 and she declines.   Next appointment: January 2020   Medication Adjustments/Labs and Tests Ordered: Current medicines are reviewed at length with the patient today.  Concerns regarding medicines are outlined above.  Orders Placed This Encounter  Procedures  . EKG 12-Lead   No orders of the defined types were placed in this encounter.   No chief complaint on file.   History of Present Illness:    Gloria Harrington is a 72 y.o. female with a hx of  hypertensive heart disease with heart failure EF 40%, Permanent Atrial Fibrillation with  anticoagulation, Dyslipidemia, and hyperkalemia with ACEI 04/22/17 last seen. Compliance with diet, lifestyle and medications: Yes except she had salt to her diet She continues to exercise and is not having edema shortness of breath palpitation syncope TIA or bleeding complication of her anticoagulant.  Her ejection fraction continues to improve is now mildly reduced I advised increasing a target dose of ARNI and she declines.  Her LDL remains severely elevated consistent with familial hyperlipidemia additional therapy is indicated I reviewed options including Zetia PCSK9 and she declines additional therapy.  She is poorly statin intolerant.  She weighs at home her weights have been stable heart rate tends to be in the range of 80 bpm blood pressure less than 242 systolic Past Medical History:  Diagnosis Date  . Breast cancer ([place]) 10/28/2016   Ductal carcinoma of left breast  . Chronic anticoagulation 08/14/2014  . Coronary artery calcification seen on CT scan 08/14/2014  . Essential hypertension 08/14/2014  . Hyperkalemia 03/24/2016  . Hyperlipidemia 08/14/2014  . Hypothyroid 09/17/2016  . PAF (paroxysmal atrial fibrillation) ([place]) 08/14/2014   Overview:  CHADS2 vasc score=4  . Palpitation 09/17/2016    Past Surgical History:  Procedure Laterality Date  . ARTHROPLASTY     for hammertoe  . BREAST BIOPSY    . HALLUX VALGUS CORRECTION    . open tenotomy of extensor second toe    . TUBAL LIGATION      Current Medications: Current Meds  Medication Sig  . calcium-vitamin D (OSCAL WITH D) 500-200 MG-UNIT tablet Take 1 tablet by mouth 2 (two) times daily.  Marland Kitchen ELIQUIS  5 MG TABS tablet Take 1 tablet (5 mg total) by mouth 2 (two) times daily.  . furosemide (LASIX) 20 MG tablet Take 1 tablet (20 mg total) by mouth daily. Take an extra tablet if you weigh 138 or greater  . levothyroxine (SYNTHROID, LEVOTHROID) 75 MCG tablet Take 75 mcg by mouth daily.  . metoprolol tartrate (LOPRESSOR) 50 MG tablet Take 1  tablet (50 mg total) by mouth 2 (two) times daily.  . Multiple Vitamin (MULTI-VITAMINS) TABS Take 1 tablet by mouth daily.  . Omega-3 Fatty Acids (FISH OIL) 1000 MG CAPS Take 2 capsules by mouth 2 (two) times daily.  . pantoprazole (PROTONIX) 40 MG tablet TK 1 T PO D  . pravastatin (PRAVACHOL) 40 MG tablet Take 1 tablet (40 mg total) by mouth daily.  . sacubitril-valsartan (ENTRESTO) 49-51 MG Take 1 tablet by mouth 2 (two) times daily.     Allergies:   Sulfamethoxazole-trimethoprim; Clindamycin; Nitrofurantoin; Penicillins; Prednisone; and Latex   Social History   Socioeconomic History  . Marital status: Married    Spouse name: Not on file  . Number of children: Not on file  . Years of education: Not on file  . Highest education level: Not on file  Occupational History  . Not on file  Social Needs  . Financial resource strain: Not on file  . Food insecurity:    Worry: Not on file    Inability: Not on file  . Transportation needs:    Medical: Not on file    Non-medical: Not on file  Tobacco Use  . Smoking status: Never Smoker  . Smokeless tobacco: Never Used  Substance and Sexual Activity  . Alcohol use: No  . Drug use: No  . Sexual activity: Not on file  Lifestyle  . Physical activity:    Days per week: Not on file    Minutes per session: Not on file  . Stress: Not on file  Relationships  . Social connections:    Talks on phone: Not on file    Gets together: Not on file    Attends religious service: Not on file    Active member of club or organization: Not on file    Attends meetings of clubs or organizations: Not on file    Relationship status: Not on file  Other Topics Concern  . Not on file  Social History Narrative  . Not on file     Family History: The patient's family history includes Breast cancer in her sister and sister; Hyperlipidemia in her sister and sister. ROS:   Please see the history of present illness.    All other systems reviewed and are  negative.  EKGs/Labs/Other Studies Reviewed:    The following studies were reviewed today:  EKG:  EKG ordered today.  The ekg ordered today demonstrates atrial fibrillation with a controlled ventricular rate  Echo 05/27/17:  - Left ventricle: The cavity size was normal. Wall thickness was normal. Systolic function was mildly reduced. The estimated ejection fraction was in the range of 45% to 50%. Wall motion was normal; there were no regional wall motion abnormalities. The study was not technically sufficient to allow evaluation of LV diastolic dysfunction due to atrial fibrillation. - Aortic valve: There was mild regurgitation. - Mitral valve: There was mild to moderate regurgitation. - Left atrium: The atrium was mildly dilated. - Right atrium: The atrium was mildly dilated. - Tricuspid valve: There was moderate regurgitation. - Pulmonary arteries: PA peak pressure: 39 mm Hg (  S).  Recent Labs: 08/11/2017 total cholesterol 229 LDL 163 on a statin creatinine normal TSH normal   Physical Exam:    VS:  BP 104/62 (BP Location: Right Arm, Patient Position: Sitting, Cuff Size: Normal)   Pulse 80   Ht 5\' 5"  (1.651 m)   Wt 139 lb 3.2 oz (63.1 kg)   SpO2 99%   BMI 23.16 kg/m     Wt Readings from Last 3 Encounters:  11/02/17 139 lb 3.2 oz (63.1 kg)  07/22/17 137 lb 6.4 oz (62.3 kg)  04/22/17 135 lb 12.8 oz (61.6 kg)     GEN:  Well nourished, well developed in no acute distress HEENT: Normal NECK: No JVD; No carotid bruits LYMPHATICS: No lymphadenopathy CARDIAC: IrrIrr variable S1 2/6 MR  RESPIRATORY:  Clear to auscultation without rales, wheezing or rhonchi  ABDOMEN: Soft, non-tender, non-distended MUSCULOSKELETAL:  No edema; No deformity  SKIN: Warm and dry NEUROLOGIC:  Alert and oriented x 3 PSYCHIATRIC:  Normal affect    Signed, Shirlee More, MD  11/02/2017 10:55 AM    [place]

## 2017-11-02 ENCOUNTER — Ambulatory Visit (INDEPENDENT_AMBULATORY_CARE_PROVIDER_SITE_OTHER): Payer: Medicare Other | Admitting: Cardiology

## 2017-11-02 VITALS — BP 104/62 | HR 80 | Ht 65.0 in | Wt 139.2 lb

## 2017-11-02 DIAGNOSIS — I110 Hypertensive heart disease with heart failure: Secondary | ICD-10-CM | POA: Diagnosis not present

## 2017-11-02 DIAGNOSIS — I482 Chronic atrial fibrillation, unspecified: Secondary | ICD-10-CM | POA: Diagnosis not present

## 2017-11-02 DIAGNOSIS — I5042 Chronic combined systolic (congestive) and diastolic (congestive) heart failure: Secondary | ICD-10-CM | POA: Diagnosis not present

## 2017-11-02 DIAGNOSIS — I5022 Chronic systolic (congestive) heart failure: Secondary | ICD-10-CM

## 2017-11-02 DIAGNOSIS — I340 Nonrheumatic mitral (valve) insufficiency: Secondary | ICD-10-CM

## 2017-11-02 DIAGNOSIS — Z7901 Long term (current) use of anticoagulants: Secondary | ICD-10-CM | POA: Diagnosis not present

## 2017-11-02 DIAGNOSIS — E785 Hyperlipidemia, unspecified: Secondary | ICD-10-CM

## 2017-11-02 NOTE — Patient Instructions (Signed)
Medication Instructions:  Your physician recommends that you continue on your current medications as directed. Please refer to the Current Medication list given to you today.  If you need a refill on your cardiac medications before your next appointment, please call your pharmacy.   Lab work: You will have lab work in December: BMP and ProBNP   If you have labs (blood work) drawn today and your tests are completely normal, you will receive your results only by: Marland Kitchen MyChart Message (if you have MyChart) OR . A paper copy in the mail If you have any lab test that is abnormal or we need to change your treatment, we will call you to review the results.  Testing/Procedures: You had an EKG today  Follow-Up: At [facility], you and your health needs are our priority.  As part of our continuing mission to provide you with exceptional heart care, we have created designated Provider Care Teams.  These Care Teams include your primary Cardiologist (physician) and Advanced Practice Providers (APPs -  Physician Assistants and Nurse Practitioners) who all work together to provide you with the care you need, when you need it. . You will need a follow up appointment in 3 months.  Any Other Special Instructions Will Be Listed Below (If Applicable).

## 2017-11-02 NOTE — Addendum Note (Signed)
Addended by: Stevan Born on: 11/02/2017 11:26 AM   Modules accepted: Orders

## 2017-11-18 ENCOUNTER — Telehealth: Payer: Self-pay | Admitting: Cardiology

## 2017-11-18 ENCOUNTER — Other Ambulatory Visit: Payer: Self-pay | Admitting: *Deleted

## 2017-11-18 MED ORDER — ELIQUIS 5 MG PO TABS: 5.0000 mg | ORAL_TABLET | Freq: Two times a day (BID) | ORAL | 3 refills | Status: DC

## 2017-11-18 NOTE — Telephone Encounter (Signed)
Patient called asking if we could go ahead and fax in [employer] patient assistance paperwork. She received a copy in the mail and is going to fax in her portion on Friday. Advised her that our portion would be faxed today. No further questions.

## 2017-11-18 NOTE — Telephone Encounter (Signed)
Returning your call, wants a call back after lunch

## 2017-12-03 DIAGNOSIS — Z86000 Personal history of in-situ neoplasm of breast: Secondary | ICD-10-CM | POA: Diagnosis not present

## 2017-12-28 DIAGNOSIS — L738 Other specified follicular disorders: Secondary | ICD-10-CM | POA: Diagnosis not present

## 2017-12-28 DIAGNOSIS — D235 Other benign neoplasm of skin of trunk: Secondary | ICD-10-CM | POA: Diagnosis not present

## 2017-12-28 DIAGNOSIS — D225 Melanocytic nevi of trunk: Secondary | ICD-10-CM | POA: Diagnosis not present

## 2017-12-28 DIAGNOSIS — L821 Other seborrheic keratosis: Secondary | ICD-10-CM | POA: Diagnosis not present

## 2017-12-29 ENCOUNTER — Telehealth: Payer: Self-pay

## 2017-12-29 DIAGNOSIS — I110 Hypertensive heart disease with heart failure: Secondary | ICD-10-CM | POA: Diagnosis not present

## 2017-12-29 DIAGNOSIS — I482 Chronic atrial fibrillation, unspecified: Secondary | ICD-10-CM | POA: Diagnosis not present

## 2017-12-29 DIAGNOSIS — I5042 Chronic combined systolic (congestive) and diastolic (congestive) heart failure: Secondary | ICD-10-CM | POA: Diagnosis not present

## 2017-12-29 DIAGNOSIS — I340 Nonrheumatic mitral (valve) insufficiency: Secondary | ICD-10-CM | POA: Diagnosis not present

## 2017-12-29 DIAGNOSIS — I5022 Chronic systolic (congestive) heart failure: Secondary | ICD-10-CM | POA: Diagnosis not present

## 2017-12-29 LAB — BASIC METABOLIC PANEL
BUN/Creatinine Ratio: 20 (ref 12–28)
BUN: 23 mg/dL (ref 8–27)
CO2: 20 mmol/L (ref 20–29)
CREATININE: 1.15 mg/dL — AB (ref 0.57–1.00)
Calcium: 9.8 mg/dL (ref 8.7–10.3)
Chloride: 102 mmol/L (ref 96–106)
GFR calc Af Amer: 55 mL/min/{1.73_m2} — ABNORMAL LOW (ref >59)
GFR calc non Af Amer: 48 mL/min/{1.73_m2} — ABNORMAL LOW (ref >59)
GLUCOSE: 99 mg/dL (ref 65–99)
Potassium: 4.4 mmol/L (ref 3.5–5.2)
SODIUM: 139 mmol/L (ref 134–144)

## 2017-12-29 LAB — PRO B NATRIURETIC PEPTIDE: NT-PRO BNP: 1384 pg/mL — AB (ref 0–301)

## 2017-12-29 MED ORDER — SACUBITRIL-VALSARTAN 49-51 MG PO TABS: 1.0000 | ORAL_TABLET | Freq: Two times a day (BID) | ORAL | 3 refills | Status: DC

## 2017-12-29 NOTE — Telephone Encounter (Signed)
Patient came into office today with [employer] Patient [place] paper work to be completed. Dr Joya Gaskins portion of paper work was completed.  Patient will fax her portion of paper work on her own.  Rx printed to be faxed.

## 2018-01-26 IMAGING — MG STEREOTACTIC CORE NEEDLE BIOPSY
7 series · 7 of 7 positions shown · non-contrast
Comparison: Previous exams.

ADDENDUM:
Pathology revealed intermediate grade ductal carcinoma in situ and
sclerosing adenosis with apocrine features in the LEFT BREAST. This
was found to be concordant by Dr. Estelle Giri. Pathology
results were discussed with the patient by telephone. The patient
reported doing well after the biopsy with tenderness at the site.
Post biopsy instructions and care were reviewed and questions were
answered. The patient was encouraged to call The [REDACTED] of
consultation has been arranged with Dr. Kaki Jim at [REDACTED], [HOSPITAL] office on October 28, 2016.

Pathology results reported by Amarie Tora RN, BSN on 10/23/2016.
CLINICAL DATA: Left upper central breast distortion.
EXAM:
LEFT BREAST STEREOTACTIC CORE NEEDLE BIOPSY

[L (1 of 7)]
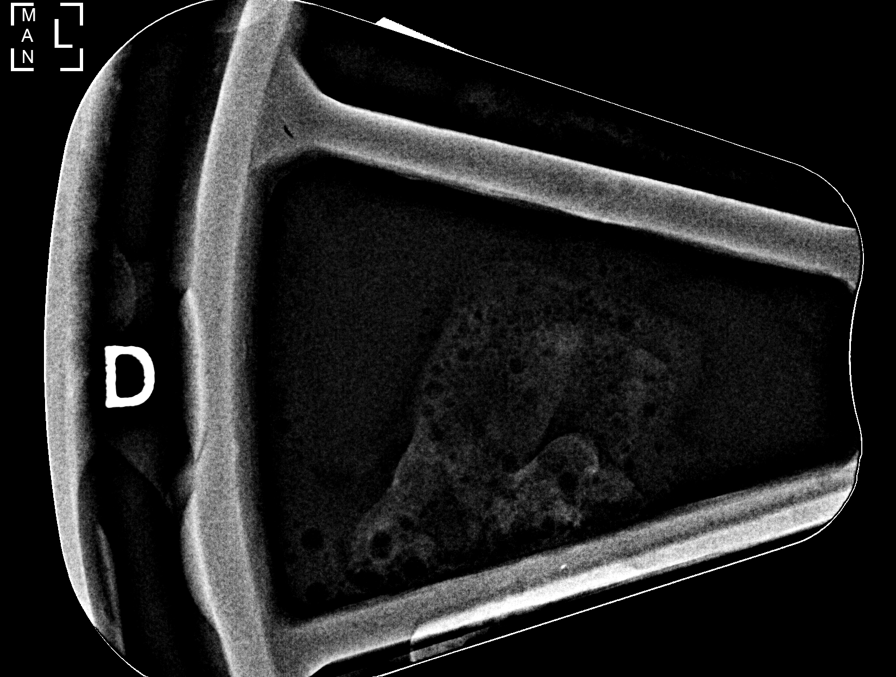

[L (2 of 7)]
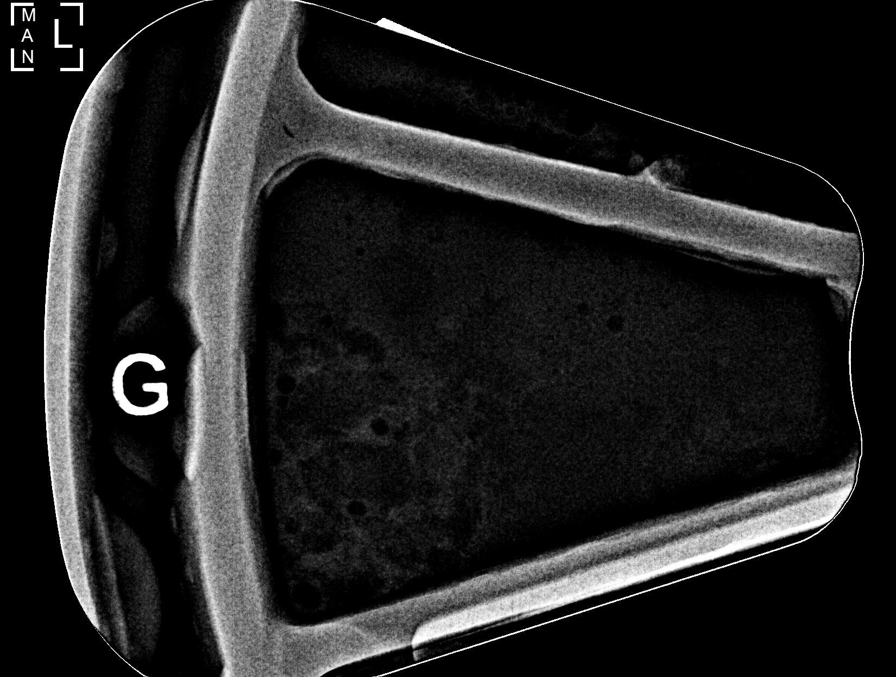

[L (3 of 7)]
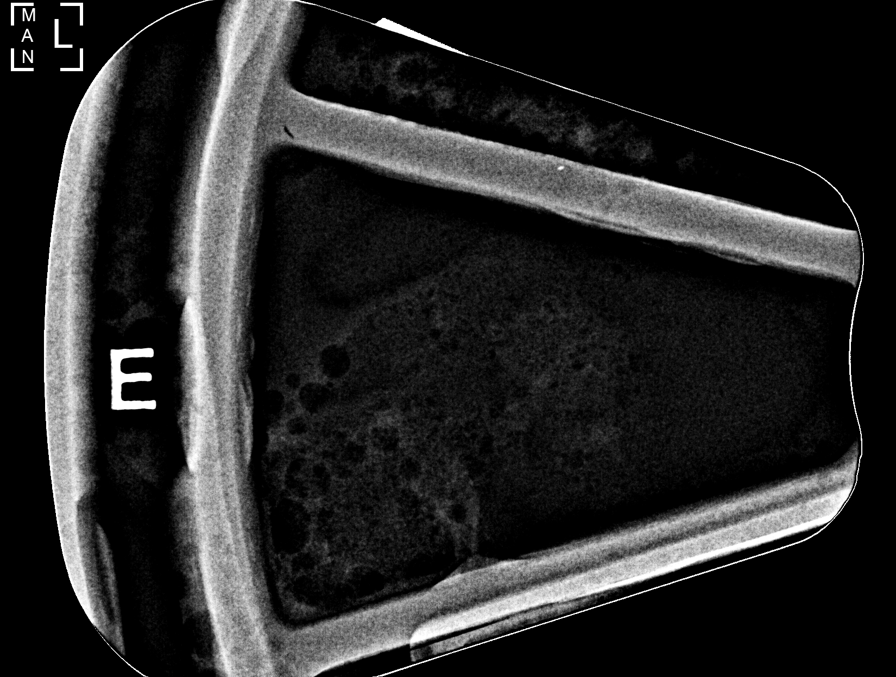

[L (4 of 7)]
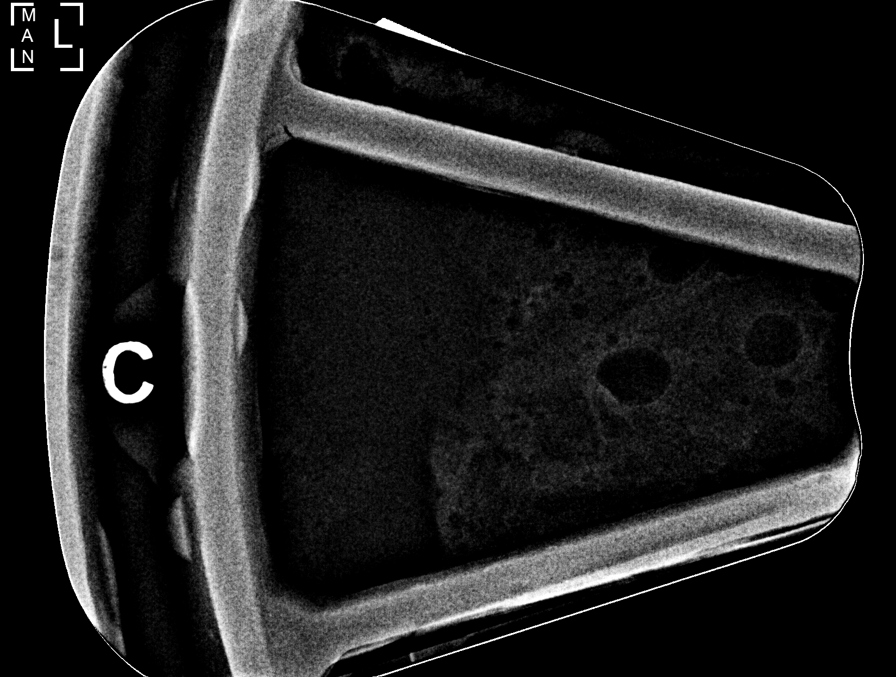

[L (5 of 7)]
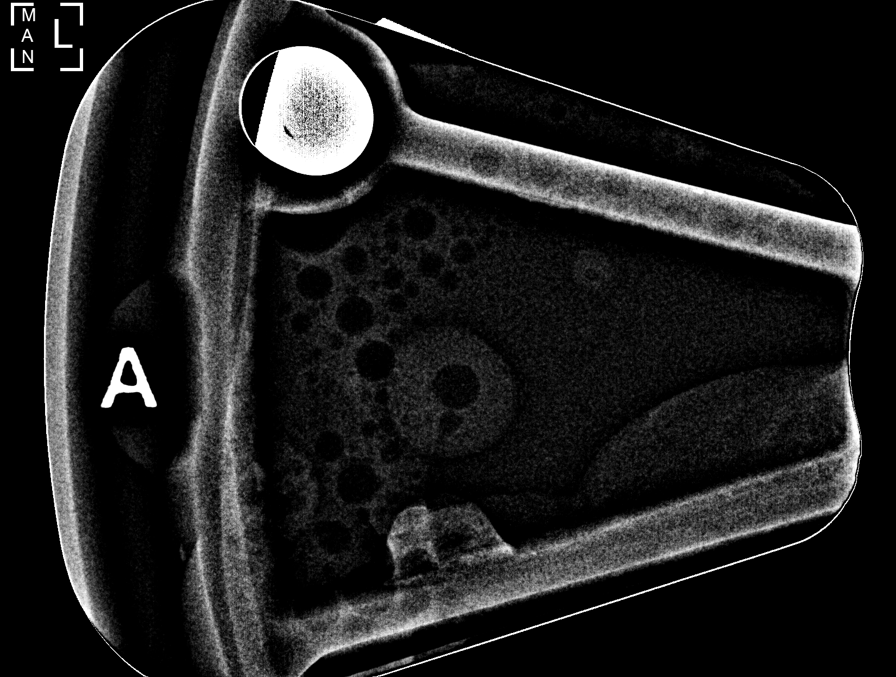

[L (6 of 7)]
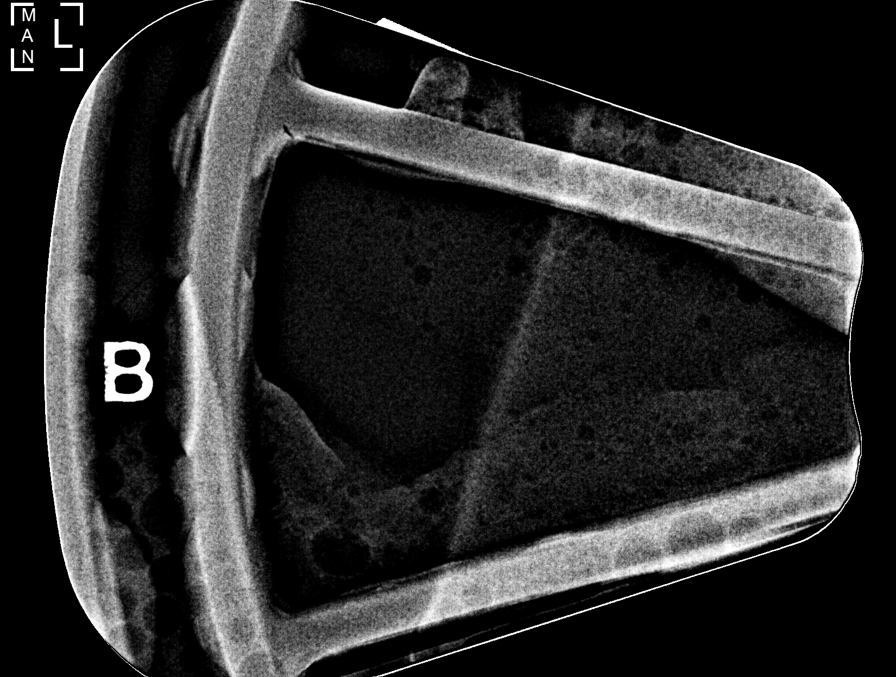

[L (7 of 7)]
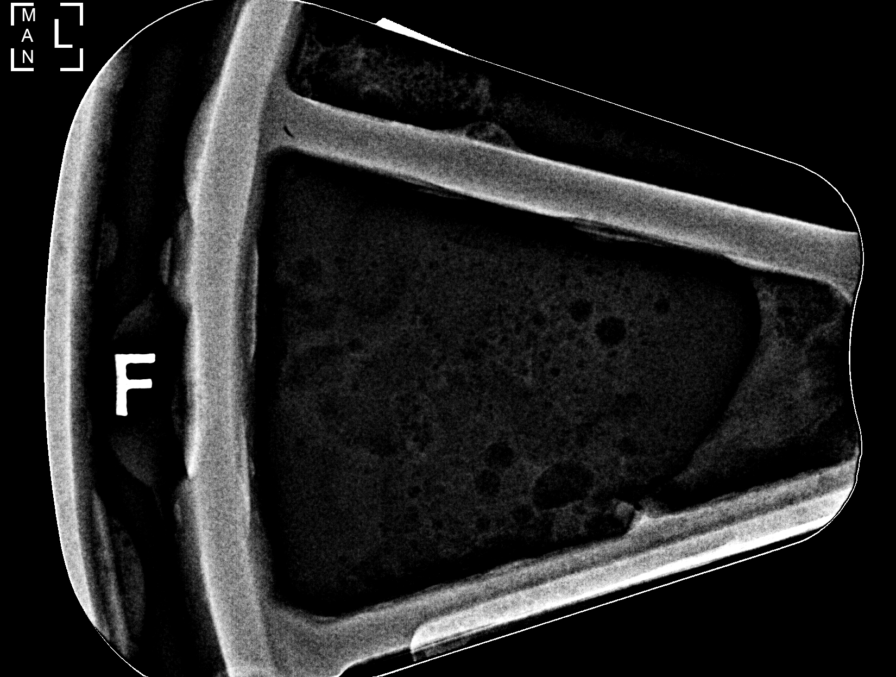

[7 of 7 positions shown; findings below may reference images not displayed]



Using sterile technique and 1% Lidocaine as local anesthetic, under
stereotactic guidance, a 9 gauge vacuum assisted device was used to
perform core needle biopsy of distortion and left upper central
breast using a superior approach. Specimen radiograph was performed
showing presence of tissue.

Lesion quadrant: Upper slightly outer.

At the conclusion of the procedure, a coil shaped tissue marker clip
was deployed into the biopsy cavity. Follow-up 2-view mammogram was
performed and dictated separately.
IMPRESSION: Stereotactic-guided biopsy of the left breast. No apparent
complications.

## 2018-01-26 IMAGING — MG MM CLIP PLACEMENT
6 series · 6 of 14 positions shown · non-contrast
Comparison: Previous exam(s).

CLINICAL DATA: Post stereotactic core needle biopsy of the left
breast.

EXAM:
DIAGNOSTIC LEFT MAMMOGRAM POST STEREOTACTIC BIOPSY

[L CC synth-2D]
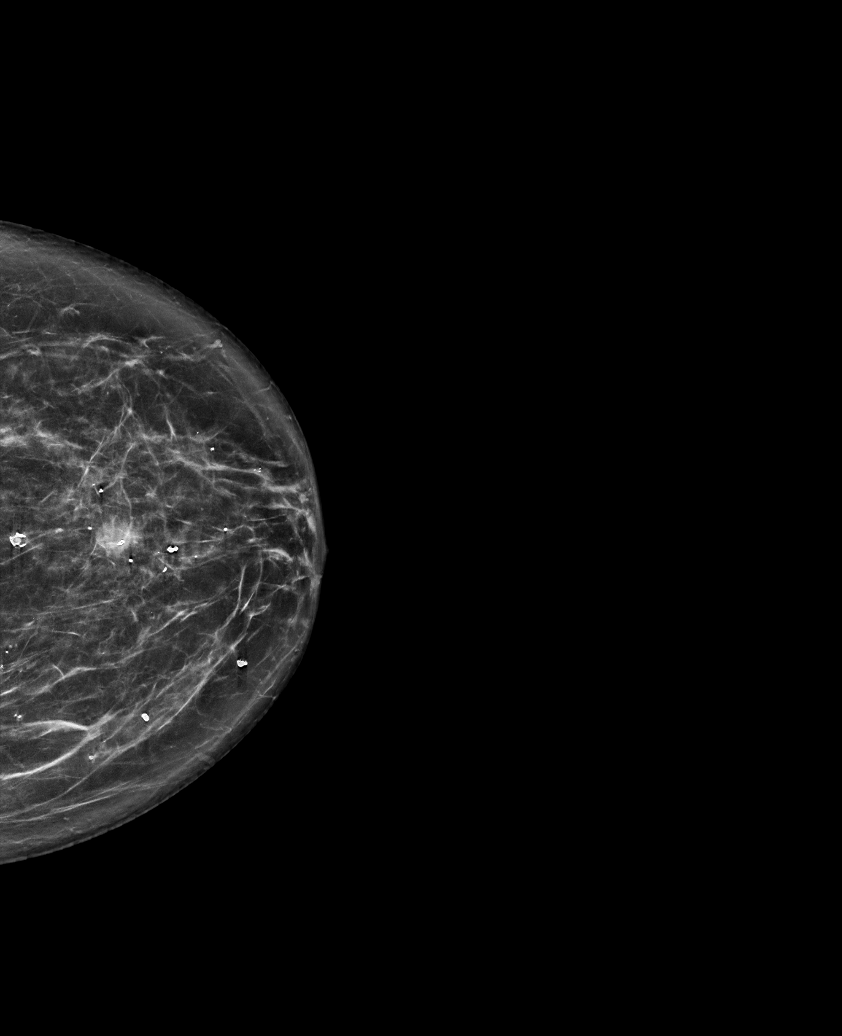

[L CC]
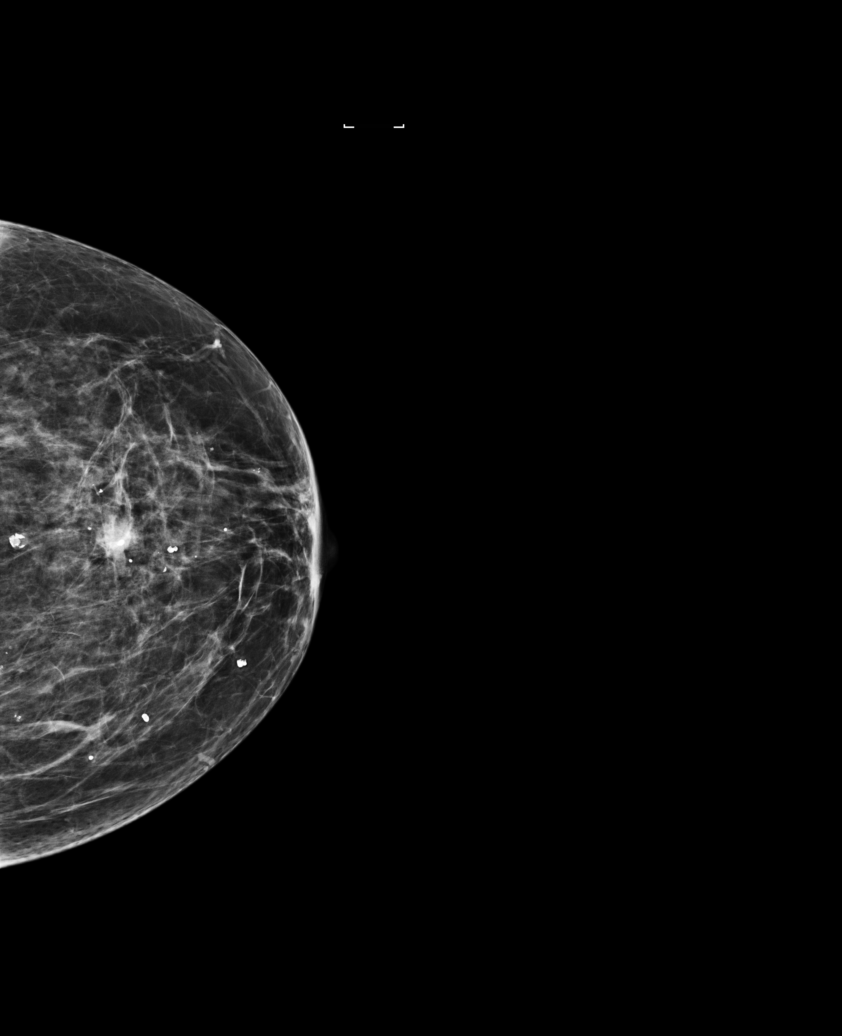

[L ML synth-2D]
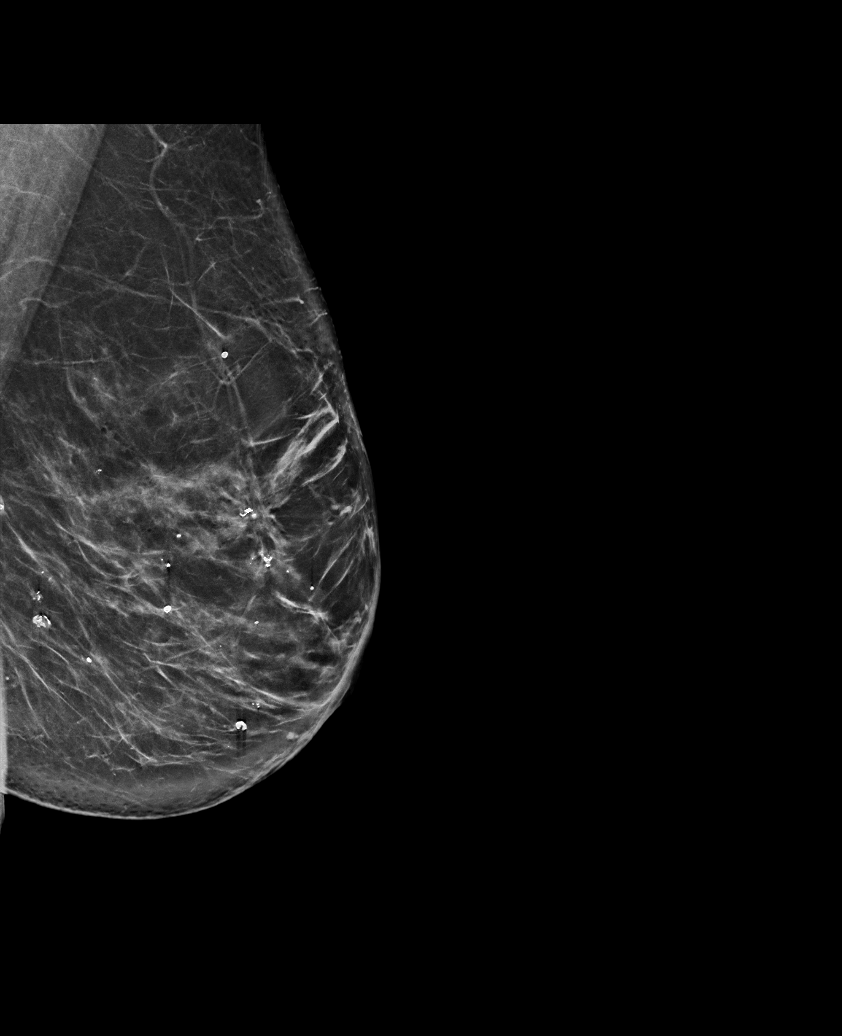

[L ML]
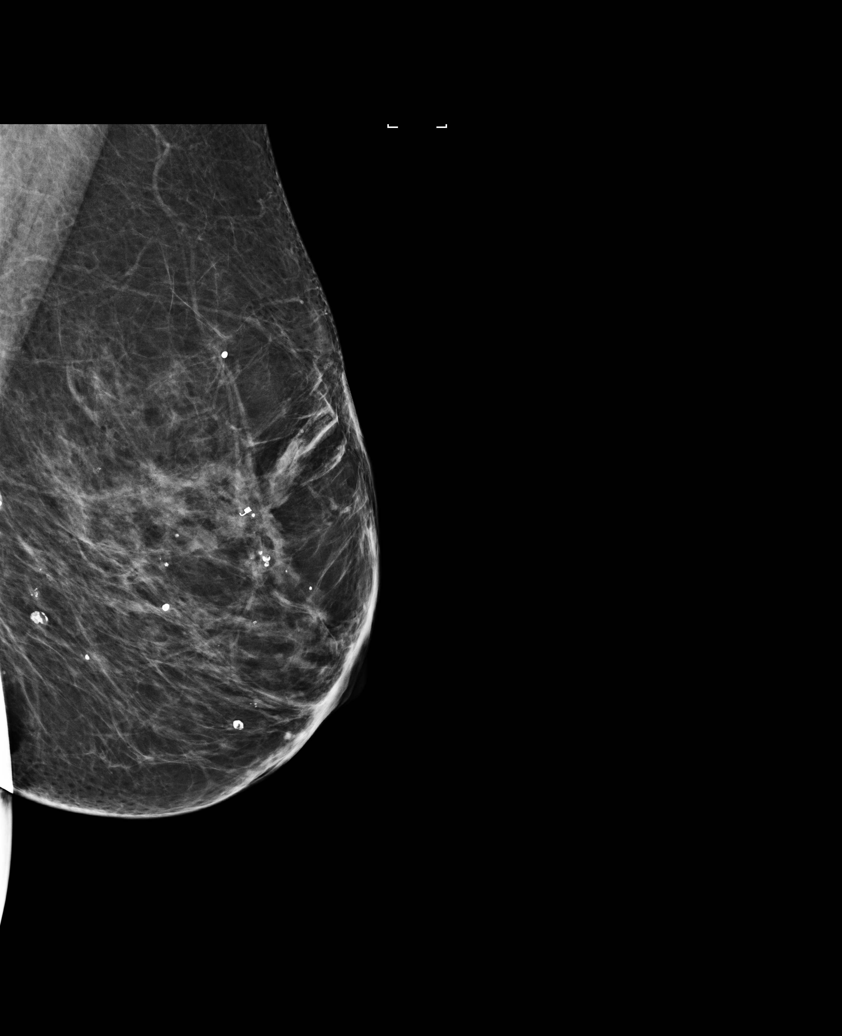

[L ML tomo · tomo slice 43/84.0]
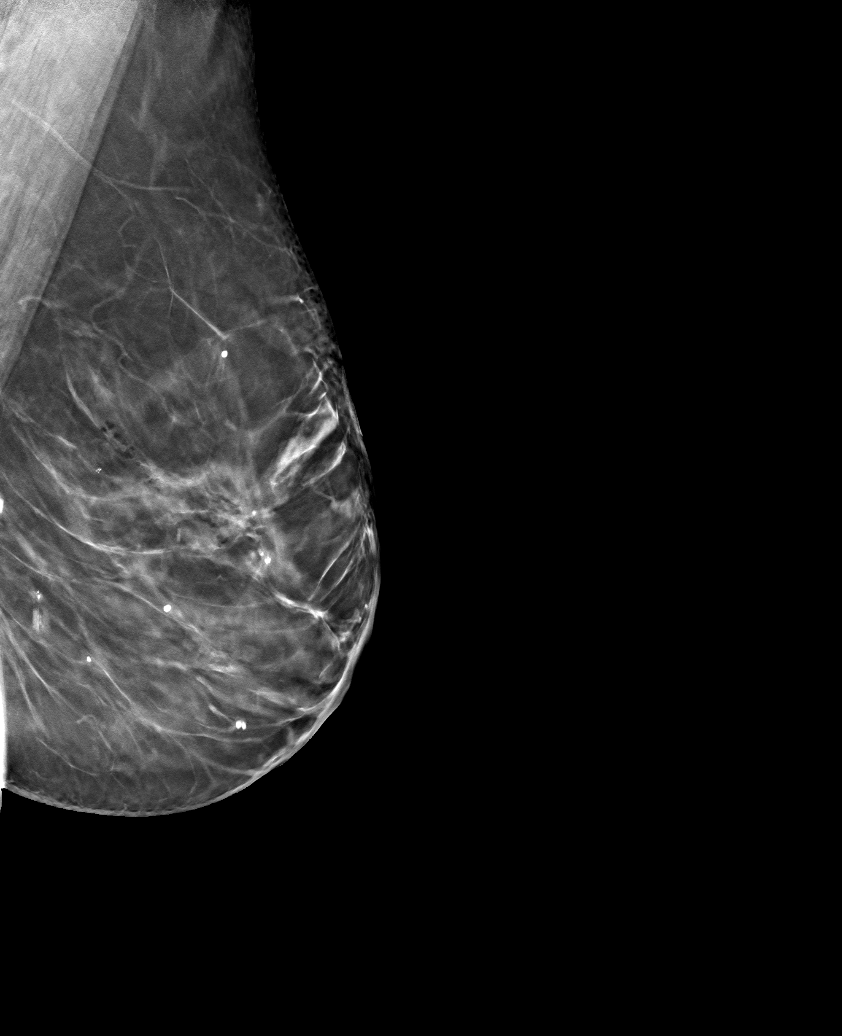

[L CC tomo · tomo slice 35/69.0]
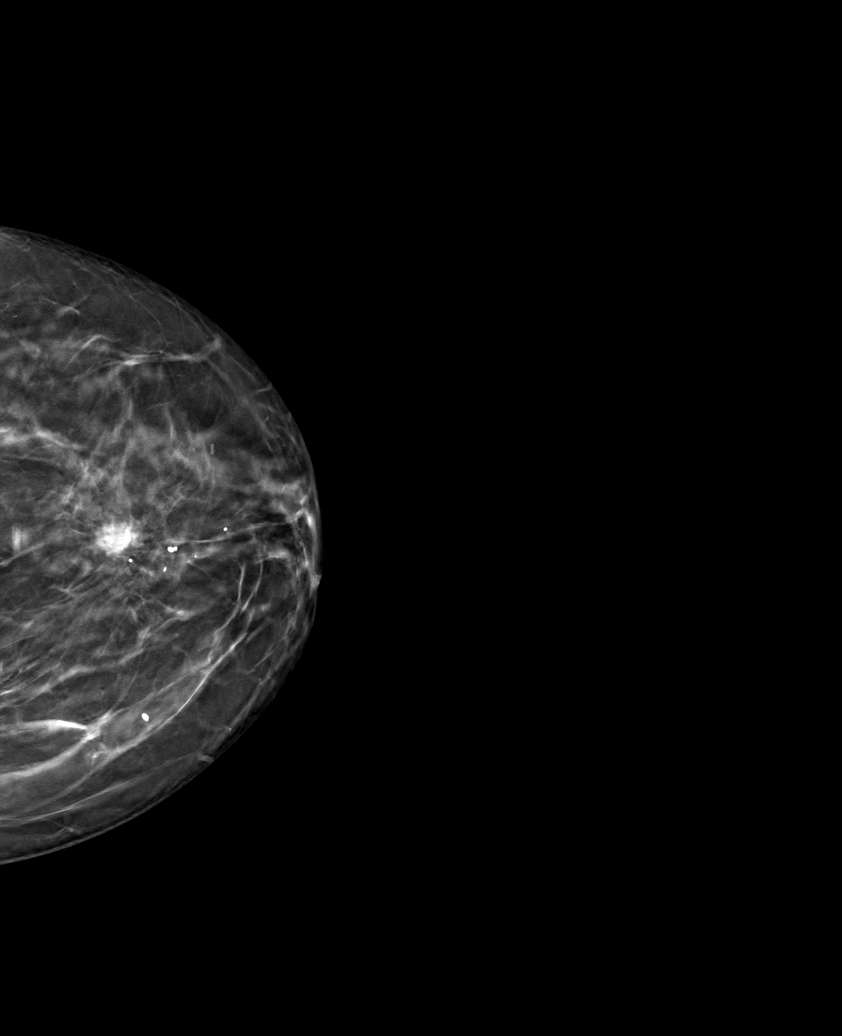

[6 of 14 positions shown; findings below may reference images not displayed]

FINDINGS: Mammographic images were obtained following stereotactic guided
biopsy of left upper central breast distortion. Two-view mammography
demonstrates present of coil shaped marker in expected radiographic
position in the left breast upper slightly outer quadrant, middle
depth. Expected post biopsy changes are seen.
IMPRESSION: Successful placement of coil shaped tissue marker, post stereotactic
core needle biopsy of left breast distortion.

Final Assessment: Post Procedure Mammograms for Marker Placement

## 2018-02-01 NOTE — Progress Notes (Signed)
Cardiology Office Note:    Date:  02/02/2018   ID:  Gloria Harrington, DOB 09-Sep-1945, MRN [id number]  PCP:  Mateo Flow, MD  Cardiologist:  Shirlee More, MD    Referring MD: Mateo Flow, MD    ASSESSMENT:    1. Chronic combined systolic and diastolic heart failure ([place])   2. Chronic atrial fibrillation   3. Nonrheumatic mitral valve regurgitation   4. Chronic anticoagulation    PLAN:    In order of problems listed above:  1. Heart failure appears compensated and concerned about her weight being up and mild exertional dyspnea on stairs check proBNP level and if elevated increase her diuretic.  Recheck echocardiogram for EF and confirmed that her mitral regurgitation functional is not severe. 2. Stable rate controlled continue current medications and anticoagulant 3. Previous echocardiogram showed improvement not severe recheck and she appears to have severe mitral regurgitation with compensated heart failure would benefit from mitral clip 4. Continue her anticoagulant   Next appointment: 3 months   Medication Adjustments/Labs and Tests Ordered: Current medicines are reviewed at length with the patient today.  Concerns regarding medicines are outlined above.  No orders of the defined types were placed in this encounter.  No orders of the defined types were placed in this encounter.   Chief Complaint  Patient presents with  . Atrial Fibrillation  . Congestive Heart Failure    History of Present Illness:    Gloria Harrington is a 73 y.o. female with a hx of  hypertensive heart disease with heart failure EF 40%, Permanent Atrial Fibrillation with anticoagulation, Dyslipidemia, and hyperkalemia with ACEI 04/22/17    last seen 11/02/17. Compliance with diet, lifestyle and medications: Yes  In general she is doing well but since the Christmas holidays her weight is up about 8 pounds and she finds herself a little short of breath climbing stairs and [facility] without any breathlessness.  On examination she has no evidence of decompensated heart failure we decided to check a proBNP level and if it has risen we will intensify her diuretics we will recheck an echocardiogram for ejection fraction and she will reconsider uptitrating her Delene Loll which she declined.  She has had no bleeding complications or anticoagulant palpitations syncope TIA no orthopnea. Past Medical History:  Diagnosis Date  . Breast cancer ([place]) 10/28/2016   Ductal carcinoma of left breast  . Chronic anticoagulation 08/14/2014  . Coronary artery calcification seen on CT scan 08/14/2014  . Essential hypertension 08/14/2014  . Hyperkalemia 03/24/2016  . Hyperlipidemia 08/14/2014  . Hypothyroid 09/17/2016  . PAF (paroxysmal atrial fibrillation) ([place]) 08/14/2014   Overview:  CHADS2 vasc score=4  . Palpitation 09/17/2016    Past Surgical History:  Procedure Laterality Date  . ARTHROPLASTY     for hammertoe  . BREAST BIOPSY    . HALLUX VALGUS CORRECTION    . open tenotomy of extensor second toe    . TUBAL LIGATION      Current Medications: Current Meds  Medication Sig  . calcium-vitamin D (OSCAL WITH D) 500-200 MG-UNIT tablet Take 1 tablet by mouth 2 (two) times daily.  Marland Kitchen ELIQUIS 5 MG TABS tablet Take 1 tablet (5 mg total) by mouth 2 (two) times daily.  . furosemide (LASIX) 20 MG tablet Take 1 tablet (20 mg total) by mouth daily. Take an extra tablet if you weigh 138 or greater  . levothyroxine (SYNTHROID, LEVOTHROID) 75 MCG tablet Take 75  mcg by mouth daily.  . metoprolol tartrate (LOPRESSOR) 50 MG tablet Take 1 tablet (50 mg total) by mouth 2 (two) times daily.  . Multiple Vitamin (MULTI-VITAMINS) TABS Take 1 tablet by mouth daily.  . Omega-3 Fatty Acids (FISH OIL) 1000 MG CAPS Take 2 capsules by mouth 2 (two) times daily.  . pantoprazole (PROTONIX) 40 MG tablet TK 1 T PO D  . pravastatin (PRAVACHOL) 40 MG tablet Take 1 tablet (40 mg total) by mouth daily.  .  sacubitril-valsartan (ENTRESTO) 49-51 MG Take 1 tablet by mouth 2 (two) times daily.     Allergies:   Sulfamethoxazole-trimethoprim; Clindamycin; Nitrofurantoin; Penicillins; Prednisone; and Latex   Social History   Socioeconomic History  . Marital status: Married    Spouse name: Not on file  . Number of children: Not on file  . Years of education: Not on file  . Highest education level: Not on file  Occupational History  . Not on file  Social Needs  . Financial resource strain: Not on file  . Food insecurity:    Worry: Not on file    Inability: Not on file  . Transportation needs:    Medical: Not on file    Non-medical: Not on file  Tobacco Use  . Smoking status: Never Smoker  . Smokeless tobacco: Never Used  Substance and Sexual Activity  . Alcohol use: No  . Drug use: No  . Sexual activity: Not on file  Lifestyle  . Physical activity:    Days per week: Not on file    Minutes per session: Not on file  . Stress: Not on file  Relationships  . Social connections:    Talks on phone: Not on file    Gets together: Not on file    Attends religious service: Not on file    Active member of club or organization: Not on file    Attends meetings of clubs or organizations: Not on file    Relationship status: Not on file  Other Topics Concern  . Not on file  Social History Narrative  . Not on file     Family History: The patient's family history includes Breast cancer in her sister and sister; Hyperlipidemia in her sister and sister. ROS:   Please see the history of present illness.    All other systems reviewed and are negative.  EKGs/Labs/Other Studies Reviewed:    The following studies were reviewed today  Recent Labs: 12/29/2017: BUN 23; Creatinine, Ser 1.15; NT-Pro BNP 1,384; Potassium 4.4; Sodium 139  Recent Lipid Panel No results found for: CHOL, TRIG, HDL, CHOLHDL, VLDL, LDLCALC, LDLDIRECT  Physical Exam:    VS:  BP 100/72 (BP Location: Right Arm, Patient  Position: Sitting, Cuff Size: Normal)   Pulse 98   Ht 5\' 5"  (1.651 m)   Wt 140 lb 9.6 oz (63.8 kg)   SpO2 99%   BMI 23.40 kg/m     Wt Readings from Last 3 Encounters:  02/02/18 140 lb 9.6 oz (63.8 kg)  11/02/17 139 lb 3.2 oz (63.1 kg)  07/22/17 137 lb 6.4 oz (62.3 kg)     GEN:  Well nourished, well developed in no acute distress HEENT: Normal NECK: No JVD; No carotid bruits LYMPHATICS: No lymphadenopathy CARDIAC: Irregular rhythm variable first heart sound 1/6 to 2/6 murmur of MR  no murmurs, rubs, gallops RESPIRATORY:  Clear to auscultation without rales, wheezing or rhonchi  ABDOMEN: Soft, non-tender, non-distended MUSCULOSKELETAL:  No edema; No deformity  SKIN:  Warm and dry NEUROLOGIC:  Alert and oriented x 3 PSYCHIATRIC:  Normal affect    Signed, Shirlee More, MD  02/02/2018 9:32 AM    [facility]

## 2018-02-02 ENCOUNTER — Encounter: Payer: Self-pay | Admitting: Cardiology

## 2018-02-02 ENCOUNTER — Ambulatory Visit: Payer: Medicare Other | Admitting: Cardiology

## 2018-02-02 VITALS — BP 100/72 | HR 98 | Ht 65.0 in | Wt 140.6 lb

## 2018-02-02 DIAGNOSIS — I482 Chronic atrial fibrillation, unspecified: Secondary | ICD-10-CM

## 2018-02-02 DIAGNOSIS — I5042 Chronic combined systolic (congestive) and diastolic (congestive) heart failure: Secondary | ICD-10-CM | POA: Diagnosis not present

## 2018-02-02 DIAGNOSIS — I340 Nonrheumatic mitral (valve) insufficiency: Secondary | ICD-10-CM | POA: Diagnosis not present

## 2018-02-02 DIAGNOSIS — Z7901 Long term (current) use of anticoagulants: Secondary | ICD-10-CM | POA: Diagnosis not present

## 2018-02-02 LAB — PRO B NATRIURETIC PEPTIDE: NT-Pro BNP: 1158 pg/mL — ABNORMAL HIGH (ref 0–301)

## 2018-02-02 NOTE — Patient Instructions (Addendum)
Medication Instructions:  Your physician recommends that you continue on your current medications as directed. Please refer to the Current Medication list given to you today.  If you need a refill on your cardiac medications before your next appointment, please call your pharmacy.   Lab work: Your physician recommends that you return for lab work today: [place].   If you have labs (blood work) drawn today and your tests are completely normal, you will receive your results only by: Marland Kitchen MyChart Message (if you have MyChart) OR . A paper copy in the mail If you have any lab test that is abnormal or we need to change your treatment, we will call you to review the results.  Testing/Procedures: Your physician has requested that you have an echocardiogram. Echocardiography is a painless test that uses sound waves to create images of your heart. It provides your doctor with information about the size and shape of your heart and how well your heart's chambers and valves are working. This procedure takes approximately one hour. There are no restrictions for this procedure.  Follow-Up: At [facility], you and your health needs are our priority.  As part of our continuing mission to provide you with exceptional heart care, we have created designated Provider Care Teams.  These Care Teams include your primary Cardiologist (physician) and Advanced Practice Providers (APPs -  Physician Assistants and Nurse Practitioners) who all work together to provide you with the care you need, when you need it. You will need a follow up appointment in 3 months.  Please call our office 2 months in advance to schedule this appointment.       Heart Failure  Weigh yourself every morning when you first wake up and record on a calender or note pad, bring this to your office visits. Using a pill tender can help with taking your medications consistently.  Limit your fluid intake to 2 liters daily  Limit your sodium intake  to less than 2-3 grams daily. Ask if you need dietary teaching.  If you gain more than 3 pounds (from your dry weight ), double your dose of diuretic for the day.  If you gain more than 5 pounds (from your dry weight), double your dose of lasix and call your heart failure doctor.  Please do not smoke tobacco since it is very bad for your heart.  Please do not drink alcohol since it can worsen your heart failure.Also avoid OTC nonsteroidal drugs, such as advil, aleve and motrin.  Try to exercise for at least 30 minutes every day because this will help your heart be more efficient. You may be eligible for supervised cardiac rehab, ask your physician.     Echocardiogram An echocardiogram is a procedure that uses painless sound waves (ultrasound) to produce an image of the heart. Images from an echocardiogram can provide important information about:  Signs of coronary artery disease (CAD).  Aneurysm detection. An aneurysm is a weak or damaged part of an artery wall that bulges out from the normal force of blood pumping through the body.  Heart size and shape. Changes in the size or shape of the heart can be associated with certain conditions, including heart failure, aneurysm, and CAD.  Heart muscle function.  Heart valve function.  Signs of a past heart attack.  Fluid buildup around the heart.  Thickening of the heart muscle.  A tumor or infectious growth around the heart valves. Tell a health care provider about:  Any allergies you  have.  All medicines you are taking, including vitamins, herbs, eye drops, creams, and over-the-counter medicines.  Any blood disorders you have.  Any surgeries you have had.  Any medical conditions you have.  Whether you are pregnant or may be pregnant. What are the risks? Generally, this is a safe procedure. However, problems may occur, including:  Allergic reaction to dye (contrast) that may be used during the procedure. What happens  before the procedure? No specific preparation is needed. You may eat and drink normally. What happens during the procedure?   An IV tube may be inserted into one of your veins.  You may receive contrast through this tube. A contrast is an injection that improves the quality of the pictures from your heart.  A gel will be applied to your chest.  A wand-like tool (transducer) will be moved over your chest. The gel will help to transmit the sound waves from the transducer.  The sound waves will harmlessly bounce off of your heart to allow the heart images to be captured in real-time motion. The images will be recorded on a computer. The procedure may vary among health care providers and hospitals. What happens after the procedure?  You may return to your normal, everyday life, including diet, activities, and medicines, unless your health care provider tells you not to do that. Summary  An echocardiogram is a procedure that uses painless sound waves (ultrasound) to produce an image of the heart.  Images from an echocardiogram can provide important information about the size and shape of your heart, heart muscle function, heart valve function, and fluid buildup around your heart.  You do not need to do anything to prepare before this procedure. You may eat and drink normally.  After the echocardiogram is completed, you may return to your normal, everyday life, unless your health care provider tells you not to do that. This information is not intended to replace advice given to you by your health care provider. Make sure you discuss any questions you have with your health care provider. Document Released: 12/28/1999 Document Revised: 02/02/2016 Document Reviewed: 02/02/2016 Elsevier Interactive Patient Education  2019 [employer].

## 2018-02-08 ENCOUNTER — Ambulatory Visit (INDEPENDENT_AMBULATORY_CARE_PROVIDER_SITE_OTHER): Payer: Medicare Other

## 2018-02-08 DIAGNOSIS — I482 Chronic atrial fibrillation, unspecified: Secondary | ICD-10-CM | POA: Diagnosis not present

## 2018-02-08 DIAGNOSIS — I5042 Chronic combined systolic (congestive) and diastolic (congestive) heart failure: Secondary | ICD-10-CM | POA: Diagnosis not present

## 2018-02-08 NOTE — Progress Notes (Signed)
Complete echocardiogram has been performed.  Jimmy Carolyn Maniscalco RDCS, RVT 

## 2018-03-03 DIAGNOSIS — H40003 Preglaucoma, unspecified, bilateral: Secondary | ICD-10-CM | POA: Diagnosis not present

## 2018-03-12 DIAGNOSIS — H40003 Preglaucoma, unspecified, bilateral: Secondary | ICD-10-CM | POA: Diagnosis not present

## 2018-04-02 ENCOUNTER — Telehealth: Payer: Self-pay

## 2018-04-02 NOTE — Telephone Encounter (Signed)
Patient advised that Eliquis has been denied by [employer] as she has to meet an out-of-pocket deductible of $523.82 before she will be covered.  Patient aware that once she reaches this total she can get a print off of her expenses from pharmacy and fax to foundation.  Patient agreed to plan and verbalized understanding.

## 2018-05-03 ENCOUNTER — Ambulatory Visit: Payer: Medicare Other | Admitting: Cardiology

## 2018-05-04 ENCOUNTER — Ambulatory Visit: Payer: Medicare Other | Admitting: Cardiology

## 2018-05-06 ENCOUNTER — Other Ambulatory Visit: Payer: Self-pay | Admitting: Cardiology

## 2018-05-06 MED ORDER — METOPROLOL TARTRATE 50 MG PO TABS: 50.0000 mg | ORAL_TABLET | Freq: Two times a day (BID) | ORAL | 0 refills | Status: DC

## 2018-05-06 MED ORDER — METOPROLOL TARTRATE 50 MG PO TABS: 50.0000 mg | ORAL_TABLET | Freq: Two times a day (BID) | ORAL | 1 refills | Status: DC

## 2018-05-06 NOTE — Telephone Encounter (Signed)
Rx sent to pharmacy   

## 2018-05-06 NOTE — Telephone Encounter (Signed)
°  Please fill today, she is out of medicine   1. Which medications need to be refilled? (please list name of each medication and dose if known) metoprolol tartrate 50mg  twice daily  2. Which pharmacy/location (including street and city if local pharmacy) is medication to be sent to?[facility] on [address] in [place]  3. Do they need a 30 day or 90 day supply? [place]

## 2018-05-07 ENCOUNTER — Telehealth: Payer: Self-pay | Admitting: Cardiology

## 2018-05-12 ENCOUNTER — Telehealth: Payer: Self-pay | Admitting: Cardiology

## 2018-05-12 NOTE — Telephone Encounter (Signed)
Patient is requesting a call back from [place] regarding the Patient Financial Assistance with [facility]  Patient can be reached at [phone]

## 2018-05-12 NOTE — Telephone Encounter (Signed)
complete

## 2018-05-13 NOTE — Telephone Encounter (Signed)
Patient advised that Eliquis has been denied by [employer] as she has to meet an out-of-pocket deductible of $523.82 before she will be covered.  She is very close to reaching this amount.  She will fax paper work once they reach deductible amount to [employer]. Patient agrees to plan verbalized understanding.

## 2018-05-18 ENCOUNTER — Other Ambulatory Visit: Payer: Self-pay | Admitting: Cardiology

## 2018-05-18 DIAGNOSIS — Z86000 Personal history of in-situ neoplasm of breast: Secondary | ICD-10-CM | POA: Diagnosis not present

## 2018-05-18 MED ORDER — PRAVASTATIN SODIUM 40 MG PO TABS: 40.0000 mg | ORAL_TABLET | Freq: Every day | ORAL | 2 refills | Status: DC

## 2018-05-18 NOTE — Telephone Encounter (Signed)
Pravastatin refill sent to [facility] on [address] in [place] per pt request

## 2018-05-18 NOTE — Telephone Encounter (Signed)
°*  STAT* If patient is at the pharmacy, call can be transferred to refill team.   1. Which medications need to be refilled? (please list name of each medication and dose if known) pravastatin (PRAVACHOL) 40 MG tablet  2. Which pharmacy/location (including street and city if local pharmacy) is medication to be sent to?  [facility] [place], [place] AT [address] [phone] (Phone) [phone] (Fax)    3. Do they need a 30 day or 90 day supply? 90 day

## 2018-06-07 ENCOUNTER — Other Ambulatory Visit: Payer: Self-pay | Admitting: Cardiology

## 2018-06-15 ENCOUNTER — Telehealth: Payer: Medicare Other | Admitting: Cardiology

## 2018-07-06 ENCOUNTER — Other Ambulatory Visit: Payer: Self-pay | Admitting: Cardiology

## 2018-07-06 DIAGNOSIS — I5022 Chronic systolic (congestive) heart failure: Secondary | ICD-10-CM

## 2018-08-04 ENCOUNTER — Telehealth: Payer: Self-pay | Admitting: Cardiology

## 2018-08-04 MED ORDER — METOPROLOL TARTRATE 50 MG PO TABS: 50.0000 mg | ORAL_TABLET | Freq: Two times a day (BID) | ORAL | 0 refills | Status: DC

## 2018-08-04 NOTE — Telephone Encounter (Signed)
°*  STAT* If patient is at the pharmacy, call can be transferred to refill team.   1. Which medications need to be refilled? (please list name of each medication and dose if known)   metoprolol tartrate (LOPRESSOR) 50 MG tablet   2. Which pharmacy/location (including street and city if local pharmacy) is medication to be sent to  [place], [place] [phone] (Phone) [phone] (Fax)    3. Do they need a 30 day or 90 day supply? 90 day

## 2018-08-04 NOTE — Telephone Encounter (Signed)
Metoprolol refill sent. Overdue for appt

## 2018-08-06 ENCOUNTER — Other Ambulatory Visit: Payer: Self-pay | Admitting: Cardiology

## 2018-08-06 MED ORDER — METOPROLOL TARTRATE 50 MG PO TABS: 50.0000 mg | ORAL_TABLET | Freq: Two times a day (BID) | ORAL | 2 refills | Status: DC

## 2018-08-06 NOTE — Telephone Encounter (Signed)
°*  STAT* If patient is at the pharmacy, call can be transferred to refill team.   1. Which medications need to be refilled? (please list name of each medication and dose if known) metoprolol tartrate (LOPRESSOR) 50 MG tablet  2. Which pharmacy/location (including street and city if local pharmacy) is medication to be sent to?  [facility]ORE [place], [place] AT [address] [phone] (Phone) [phone] (Fax)    3. Do they need a 30 day or 90 day supply? 90 day

## 2018-08-06 NOTE — Telephone Encounter (Signed)
Metoprolol tartrate 50 mg twice daily refilled.

## 2018-08-16 NOTE — Progress Notes (Signed)
Cardiology Office Note:    Date:  08/17/2018   ID:  Gloria Harrington, DOB 22-Mar-1945, MRN [id number]  PCP:  Mateo Flow, MD  Cardiologist:  Shirlee More, MD    Referring MD: Mateo Flow, MD    ASSESSMENT:    PLAN:    In order of problems listed above:  1. A. fib stable rate control with beta-blocker continue the same along with anticoagulant 2. Stable anticoagulation.  Continue Eliquis 3. Cardiomyopathy markedly improved EF is normalized with guideline directed therapy including Entresto. 4. Heart failure combined systolic diastolic improved stable New York Heart Association class I continue her loop diuretic along with beta-blocker and Entresto 5. Hyperlipidemia stable continue her statin 6. Amaurosis fugax despite anticoagulation, conceivably this may have been or of migraine but I will ask her to have a carotid duplex performed and if she had severe stenotic lesion would benefit from vascular surgery evaluation and consideration of revascularization   Next appointment: 6 months   Medication Adjustments/Labs and Tests Ordered: Current medicines are reviewed at length with the patient today.  Concerns regarding medicines are outlined above.  No orders of the defined types were placed in this encounter.  No orders of the defined types were placed in this encounter.   Chief Complaint  Patient presents with  . Follow-up    History of Present Illness:    Gloria Harrington is a 73 y.o. female with a hx of hypertensive heart disease with heart failure EF 40%, Permanent Atrial Fibrillation with anticoagulation, Dyslipidemia, and hyperkalemia with ACEI  last seen 02/02/2018.Echo 02/08/2018 with EF 55-60% mild LAE mild MR. Compliance with diet, lifestyle and medications: Yes  She truly misses not being able to go one weight is up a couple pounds but no edema shortness of breath.  Overall she is done extremely well except one morning she had typical amaurosis fugax loss of vision  in her right eye for few minutes.  She has a background history of migraine but has not had one in many years and she had no headache associated with it.  She did not miss her anticoagulant and there is been no recurrence.  She is scheduled for labs tomorrow at her PCP office and I will asked him to be sure we check a proBNP level.  Home blood pressure runs 1 16-9 30 systolic and A. fib rate 45-03 bpm she meticulously weighs checks blood pressure and pulse every day. Past Medical History:  Diagnosis Date  . Breast cancer ([place]) 10/28/2016   Ductal carcinoma of left breast  . Chronic anticoagulation 08/14/2014  . Coronary artery calcification seen on CT scan 08/14/2014  . Essential hypertension 08/14/2014  . Hyperkalemia 03/24/2016  . Hyperlipidemia 08/14/2014  . Hypothyroid 09/17/2016  . PAF (paroxysmal atrial fibrillation) ([place]) 08/14/2014   Overview:  CHADS2 vasc score=4  . Palpitation 09/17/2016    Past Surgical History:  Procedure Laterality Date  . ARTHROPLASTY     for hammertoe  . BREAST BIOPSY    . HALLUX VALGUS CORRECTION    . open tenotomy of extensor second toe    . TUBAL LIGATION      Current Medications: Current Meds  Medication Sig  . calcium-vitamin D (OSCAL WITH D) 500-200 MG-UNIT tablet Take 1 tablet by mouth 2 (two) times daily.  Marland Kitchen ELIQUIS 5 MG TABS tablet TAKE 1 TABLET BY MOUTH TWICE DAILY  . furosemide (LASIX) 20 MG tablet TAKE 1 TABLET BY MOUTH DAILY. TAKE AN EXTRA TABLET IF  YOU WEIGHT 138 OR GREATER  . levothyroxine (SYNTHROID, LEVOTHROID) 75 MCG tablet Take 75 mcg by mouth daily.  . metoprolol tartrate (LOPRESSOR) 50 MG tablet Take 1 tablet (50 mg total) by mouth 2 (two) times daily. **NO REFILLS UNTIL SEEN BY PROVIDER**  . Multiple Vitamin (MULTI-VITAMINS) TABS Take 1 tablet by mouth daily.  . Omega-3 Fatty Acids (FISH OIL) 1000 MG CAPS Take 2 capsules by mouth 2 (two) times daily.  . pantoprazole (PROTONIX) 40 MG tablet TK 1 T PO D  . pravastatin (PRAVACHOL) 40 MG tablet  Take 1 tablet (40 mg total) by mouth daily.  . sacubitril-valsartan (ENTRESTO) 49-51 MG Take 1 tablet by mouth 2 (two) times daily.     Allergies:   Sulfamethoxazole-trimethoprim, Clindamycin, Nitrofurantoin, Penicillins, Prednisone, and Latex   Social History   Socioeconomic History  . Marital status: Married    Spouse name: Not on file  . Number of children: Not on file  . Years of education: Not on file  . Highest education level: Not on file  Occupational History  . Not on file  Social Needs  . Financial resource strain: Not on file  . Food insecurity    Worry: Not on file    Inability: Not on file  . Transportation needs    Medical: Not on file    Non-medical: Not on file  Tobacco Use  . Smoking status: Never Smoker  . Smokeless tobacco: Never Used  Substance and Sexual Activity  . Alcohol use: No  . Drug use: No  . Sexual activity: Not on file  Lifestyle  . Physical activity    Days per week: Not on file    Minutes per session: Not on file  . Stress: Not on file  Relationships  . Social Herbalist on phone: Not on file    Gets together: Not on file    Attends religious service: Not on file    Active member of club or organization: Not on file    Attends meetings of clubs or organizations: Not on file    Relationship status: Not on file  Other Topics Concern  . Not on file  Social History Narrative  . Not on file     Family History: The patient's family history includes Breast cancer in her sister and sister; Hyperlipidemia in her sister and sister. ROS:   Please see the history of present illness.    All other systems reviewed and are negative.  EKGs/Labs/Other Studies Reviewed:    The following studies were reviewed today:     Echo 02/08/2018:   Study Conclusions - Left ventricle: The cavity size was normal. Wall thickness was   normal. Systolic function was normal. The estimated ejection   fraction was in the range of 55% to 60%. Wall  motion was normal;   there were no regional wall motion abnormalities. - Mitral valve: There was mild regurgitation. - Left atrium: The atrium was moderately dilated. - Pulmonary arteries: PA peak pressure: 35 mm Hg (S).  Recent Labs: 12/29/2017: BUN 23; Creatinine, Ser 1.15; Potassium 4.4; Sodium 139 02/02/2018: NT-Pro BNP 1,158  Recent Lipid Panel No results found for: CHOL, TRIG, HDL, CHOLHDL, VLDL, LDLCALC, LDLDIRECT  Physical Exam:    VS:  BP 108/70 (BP Location: Right Arm, Patient Position: Sitting, Cuff Size: Normal)   Pulse 86   Ht 5\' 5"  (1.651 m)   Wt 143 lb (64.9 kg)   SpO2 98%   BMI 23.80 kg/m  Wt Readings from Last 3 Encounters:  08/17/18 143 lb (64.9 kg)  02/02/18 140 lb 9.6 oz (63.8 kg)  11/02/17 139 lb 3.2 oz (63.1 kg)     GEN:  Well nourished, well developed in no acute distress HEENT: Normal NECK: No JVD; No carotid bruits LYMPHATICS: No lymphadenopathy CARDIAC: Grade 1/6 murmur of MR RRR, no murmurs, rubs, gallops RESPIRATORY:  Clear to auscultation without rales, wheezing or rhonchi  ABDOMEN: Soft, non-tender, non-distended MUSCULOSKELETAL:  No edema; No deformity  SKIN: Warm and dry NEUROLOGIC:  Alert and oriented x 3 PSYCHIATRIC:  Normal affect    Signed, Shirlee More, MD  08/17/2018 11:55 AM    [place]

## 2018-08-17 ENCOUNTER — Other Ambulatory Visit: Payer: Self-pay

## 2018-08-17 ENCOUNTER — Ambulatory Visit (INDEPENDENT_AMBULATORY_CARE_PROVIDER_SITE_OTHER): Payer: Medicare Other | Admitting: Cardiology

## 2018-08-17 ENCOUNTER — Encounter: Payer: Self-pay | Admitting: Cardiology

## 2018-08-17 VITALS — BP 108/70 | HR 86 | Ht 65.0 in | Wt 143.0 lb

## 2018-08-17 DIAGNOSIS — I429 Cardiomyopathy, unspecified: Secondary | ICD-10-CM | POA: Diagnosis not present

## 2018-08-17 DIAGNOSIS — G453 Amaurosis fugax: Secondary | ICD-10-CM

## 2018-08-17 DIAGNOSIS — Z7901 Long term (current) use of anticoagulants: Secondary | ICD-10-CM

## 2018-08-17 DIAGNOSIS — I5042 Chronic combined systolic (congestive) and diastolic (congestive) heart failure: Secondary | ICD-10-CM

## 2018-08-17 DIAGNOSIS — E785 Hyperlipidemia, unspecified: Secondary | ICD-10-CM

## 2018-08-17 DIAGNOSIS — I482 Chronic atrial fibrillation, unspecified: Secondary | ICD-10-CM

## 2018-08-17 DIAGNOSIS — I6529 Occlusion and stenosis of unspecified carotid artery: Secondary | ICD-10-CM | POA: Diagnosis not present

## 2018-08-17 HISTORY — DX: Amaurosis fugax: G45.3

## 2018-08-17 NOTE — Patient Instructions (Signed)
Medication Instructions:  Your physician recommends that you continue on your current medications as directed. Please refer to the Current Medication list given to you today.  If you need a refill on your cardiac medications before your next appointment, please call your pharmacy.   Lab work: None If you have labs (blood work) drawn today and your tests are completely normal, you will receive your results only by: Marland Kitchen MyChart Message (if you have MyChart) OR . A paper copy in the mail If you have any lab test that is abnormal or we need to change your treatment, we will call you to review the results.  Testing/Procedures: Your physician has requested that you have a carotid duplex. This test is an ultrasound of the carotid arteries in your neck. It looks at blood flow through these arteries that supply the brain with blood. Allow one hour for this exam. There are no restrictions or special instructions.   Follow-Up: At [facility], you and your health needs are our priority.  As part of our continuing mission to provide you with exceptional heart care, we have created designated Provider Care Teams.  These Care Teams include your primary Cardiologist (physician) and Advanced Practice Providers (APPs -  Physician Assistants and Nurse Practitioners) who all work together to provide you with the care you need, when you need it. You will need a follow up appointment in 6 months.  Please call our office 2 months in advance to schedule this appointment.  You may see No primary care provider on file. or another member of our [employer] Provider Team in [place]: Jenne Campus, MD . Jyl Heinz, MD  Any Other Special Instructions Will Be Listed Below (If Applicable).

## 2018-08-18 DIAGNOSIS — Z0000 Encounter for general adult medical examination without abnormal findings: Secondary | ICD-10-CM | POA: Diagnosis not present

## 2018-08-18 DIAGNOSIS — E039 Hypothyroidism, unspecified: Secondary | ICD-10-CM | POA: Diagnosis not present

## 2018-08-18 DIAGNOSIS — E782 Mixed hyperlipidemia: Secondary | ICD-10-CM | POA: Diagnosis not present

## 2018-08-18 DIAGNOSIS — Z79899 Other long term (current) drug therapy: Secondary | ICD-10-CM | POA: Diagnosis not present

## 2018-08-18 DIAGNOSIS — I509 Heart failure, unspecified: Secondary | ICD-10-CM | POA: Diagnosis not present

## 2018-08-18 DIAGNOSIS — I4891 Unspecified atrial fibrillation: Secondary | ICD-10-CM | POA: Diagnosis not present

## 2018-08-18 DIAGNOSIS — I10 Essential (primary) hypertension: Secondary | ICD-10-CM | POA: Diagnosis not present

## 2018-08-19 DIAGNOSIS — Z79899 Other long term (current) drug therapy: Secondary | ICD-10-CM | POA: Diagnosis not present

## 2018-09-01 ENCOUNTER — Other Ambulatory Visit: Payer: Self-pay

## 2018-09-01 ENCOUNTER — Ambulatory Visit (INDEPENDENT_AMBULATORY_CARE_PROVIDER_SITE_OTHER): Payer: Medicare Other

## 2018-09-01 DIAGNOSIS — H40003 Preglaucoma, unspecified, bilateral: Secondary | ICD-10-CM | POA: Diagnosis not present

## 2018-09-01 DIAGNOSIS — I6529 Occlusion and stenosis of unspecified carotid artery: Secondary | ICD-10-CM | POA: Diagnosis not present

## 2018-09-01 NOTE — Progress Notes (Signed)
Carotid duplex exam has been Performed. No ICA stenosis was seen.  Jimmy Nykiah Ma RDCS, RVT

## 2018-09-28 DIAGNOSIS — Z853 Personal history of malignant neoplasm of breast: Secondary | ICD-10-CM | POA: Diagnosis not present

## 2018-10-14 DIAGNOSIS — Z6824 Body mass index (BMI) 24.0-24.9, adult: Secondary | ICD-10-CM | POA: Diagnosis not present

## 2018-10-14 DIAGNOSIS — L255 Unspecified contact dermatitis due to plants, except food: Secondary | ICD-10-CM | POA: Diagnosis not present

## 2018-10-19 DIAGNOSIS — S30860A Insect bite (nonvenomous) of lower back and pelvis, initial encounter: Secondary | ICD-10-CM | POA: Diagnosis not present

## 2018-10-20 DIAGNOSIS — R928 Other abnormal and inconclusive findings on diagnostic imaging of breast: Secondary | ICD-10-CM | POA: Diagnosis not present

## 2018-10-20 DIAGNOSIS — D0512 Intraductal carcinoma in situ of left breast: Secondary | ICD-10-CM | POA: Diagnosis not present

## 2018-10-30 ENCOUNTER — Other Ambulatory Visit: Payer: Self-pay | Admitting: Cardiology

## 2018-10-30 DIAGNOSIS — I5022 Chronic systolic (congestive) heart failure: Secondary | ICD-10-CM

## 2018-11-01 NOTE — Telephone Encounter (Signed)
Refill sent to [facility] in [place]

## 2018-11-02 ENCOUNTER — Other Ambulatory Visit: Payer: Self-pay | Admitting: *Deleted

## 2018-11-02 ENCOUNTER — Telehealth: Payer: Self-pay | Admitting: Cardiology

## 2018-11-02 ENCOUNTER — Other Ambulatory Visit: Payer: Self-pay | Admitting: Cardiology

## 2018-11-02 MED ORDER — METOPROLOL TARTRATE 50 MG PO TABS: 50.0000 mg | ORAL_TABLET | Freq: Two times a day (BID) | ORAL | 1 refills | Status: DC

## 2018-11-02 NOTE — Telephone Encounter (Signed)
Refill sent to [facility].  

## 2018-11-02 NOTE — Telephone Encounter (Signed)
°*  STAT* If patient is at the pharmacy, call can be transferred to refill team.   1. Which medications need to be refilled? (please list name of each medication and dose if known) Metoprolol Tarttrate 50mg  takes twice daily   2. Which pharmacy/location (including street and city if local pharmacy) is medication to be sent to? [facility] [address]  3. Do they need a 30 day or 90 day supply? [place]

## 2018-11-02 NOTE — Telephone Encounter (Signed)
°*  STAT* If patient is at the pharmacy, call can be transferred to refill team.   1. Which medications need to be refilled? (please list name of each medication and dose if known) Metoprolol tartrate 50mg   2. Which pharmacy/location (including street and city if local pharmacy) is medication to be sent to[facility]  3. Do they need a 30 day or 90 day supply? [place]

## 2018-11-03 NOTE — Telephone Encounter (Signed)
Duplicate message.  Rx sent.

## 2018-11-18 DIAGNOSIS — Z86000 Personal history of in-situ neoplasm of breast: Secondary | ICD-10-CM | POA: Diagnosis not present

## 2018-11-23 ENCOUNTER — Telehealth: Payer: Self-pay | Admitting: Cardiology

## 2018-11-23 NOTE — Telephone Encounter (Signed)
Wants you to call her about getting help with her Eliquis

## 2018-11-25 NOTE — Telephone Encounter (Signed)
Called patient who requested to speak with Gloria Harrington only. Will have Gloria Harrington call her back at her earliest convenience.

## 2018-11-25 NOTE — Telephone Encounter (Signed)
Please call the Indigent program for her Elliquis, they called this am

## 2018-11-29 NOTE — Telephone Encounter (Signed)
Spoke with patient regarding patient assistance paper work.  Patient made aware that we will complete our portion of the paper work for [employer] and fax to program.  Patient agreed to plan and verbalized understanding.  No further questions.

## 2018-11-30 ENCOUNTER — Other Ambulatory Visit: Payer: Self-pay | Admitting: *Deleted

## 2018-11-30 MED ORDER — ELIQUIS 5 MG PO TABS: 5.0000 mg | ORAL_TABLET | Freq: Two times a day (BID) | ORAL | 3 refills | Status: DC

## 2018-12-06 ENCOUNTER — Telehealth: Payer: Self-pay | Admitting: Cardiology

## 2018-12-06 NOTE — Telephone Encounter (Signed)
Touching base about her Gloria Harrington

## 2018-12-13 DIAGNOSIS — E039 Hypothyroidism, unspecified: Secondary | ICD-10-CM | POA: Diagnosis not present

## 2018-12-13 DIAGNOSIS — E782 Mixed hyperlipidemia: Secondary | ICD-10-CM | POA: Diagnosis not present

## 2018-12-14 NOTE — Telephone Encounter (Signed)
Returned call to patient regarding patient assistance for [employer].  Patient will bring by paper work to be completed and faxed tomorrow 12-15-2018.  Patient agreed to plan and verbalized understanding.  No further questions.

## 2018-12-16 ENCOUNTER — Other Ambulatory Visit: Payer: Self-pay | Admitting: *Deleted

## 2018-12-16 MED ORDER — ENTRESTO 49-51 MG PO TABS: 1.0000 | ORAL_TABLET | Freq: Two times a day (BID) | ORAL | 3 refills | Status: DC

## 2018-12-30 DIAGNOSIS — L820 Inflamed seborrheic keratosis: Secondary | ICD-10-CM | POA: Diagnosis not present

## 2018-12-30 DIAGNOSIS — L821 Other seborrheic keratosis: Secondary | ICD-10-CM | POA: Diagnosis not present

## 2018-12-30 DIAGNOSIS — D225 Melanocytic nevi of trunk: Secondary | ICD-10-CM | POA: Diagnosis not present

## 2019-02-14 NOTE — Progress Notes (Signed)
Cardiology Office Note:    Date:  02/15/2019   ID:  Gloria Harrington, DOB [id number], MRN [id number]  PCP:  Mateo Flow, MD  Cardiologist:  Shirlee More, MD    Referring MD: Mateo Flow, MD    ASSESSMENT:    1. Chronic combined systolic and diastolic heart failure ([place])   2. Hypertensive heart disease with chronic systolic congestive heart failure ([place])   3. Chronic atrial fibrillation ([place])   4. Chronic anticoagulation    PLAN:    In order of problems listed above:  1. Heart failure is improved EF is normalized she is compensated New York Heart Association class I and remarkably improved with Entresto continue her small dose of loop diuretic beta-blocker and Entresto. 2. BP stable continue guideline directed therapy 3. Rate is controlled with beta-blocker and continue her current anticoagulant  They will check labs including a CMP renal function proBNP and lipid profile for safety and efficacy Next appointment: 6 months   Medication Adjustments/Labs and Tests Ordered: Current medicines are reviewed at length with the patient today.  Concerns regarding medicines are outlined above.  Orders Placed This Encounter  Procedures  . Comprehensive Metabolic Panel (CMET)  . Lipid Profile  . Pro b natriuretic peptide (BNP)  . TSH+T4F+T3Free  . EKG 12-Lead   Meds ordered this encounter  Medications  . pravastatin (PRAVACHOL) 40 MG tablet    Sig: Take 1 tablet (40 mg total) by mouth daily.    Dispense:  90 tablet    Refill:  2  . DISCONTD: ELIQUIS 5 MG TABS tablet    Sig: Take 1 tablet (5 mg total) by mouth 2 (two) times daily.    Dispense:  180 tablet    Refill:  3  . furosemide (LASIX) 20 MG tablet    Sig: TAKE 1 TABLET BY MOUTH DAILY. TAKE AN EXTRA TABLET IF YOU WEIGHT 138 OR GREATER    Dispense:  60 tablet    Refill:  3  . sacubitril-valsartan (ENTRESTO) 49-51 MG    Sig: Take 1 tablet by mouth 2 (two) times daily.    Dispense:  180 tablet    Refill:  3    . metoprolol tartrate (LOPRESSOR) 50 MG tablet    Sig: Take 1 tablet (50 mg total) by mouth 2 (two) times daily.    Dispense:  180 tablet    Refill:  1    No refills until seen by provider  . ELIQUIS 5 MG TABS tablet    Sig: Take 1 tablet (5 mg total) by mouth 2 (two) times daily.    Dispense:  180 tablet    Refill:  3    Chief Complaint  Patient presents with  . Follow-up  . Atrial Fibrillation  . Anticoagulation  . Congestive Heart Failure  . Mitral Regurgitation    History of Present Illness:    Gloria Harrington is a 74 y.o. female with a hx of  hypertensive heart disease with heart failure EF 40%, Permanent Atrial Fibrillation with anticoagulation, Dyslipidemia, and hyperkalemia with ACEI. Echo 02/08/2018 with EF 55-60% mild LAE mild MR.   last seen 08/17/2018.  She had a carotid duplex performed after that visit showing no stenosis in the right ICA and very mild atherosclerosis 1 to 39% stenosis in the left ICA.  Vertebral flow was normal. Compliance with diet, lifestyle and medications: Yes  Gloria Harrington takes meticulous care of herself her weight is stable blood pressure heart rate in  range exercise of [place] [employer] 3 days a week.  Has had no edema shortness of breath chest pain or syncope and she has a little palpitation when she climbs stairs but not on a treadmill.  Tolerates her low intensity statin will recheck labs today as has not received COVID-19 immunization and I tried to give her positive information. Past Medical History:  Diagnosis Date  . 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 Chronic anticoagulation 08/14/2014  . Coronary artery calcification seen on CT scan 08/14/2014  . Essential hypertension 08/14/2014  . Hyperkalemia 03/24/2016  . Hyperlipidemia 08/14/2014  . Hypothyroid 09/17/2016  . PAF (paroxysmal atrial fibrillation) ([place]) 08/14/2014   Overview:  CHADS2 vasc score=4  . Palpitation 09/17/2016    Past Surgical History:  Procedure  Laterality Date  . ARTHROPLASTY     for hammertoe  . BREAST BIOPSY    . HALLUX VALGUS CORRECTION    . open tenotomy of extensor second toe    . TUBAL LIGATION      Current Medications: Current Meds  Medication Sig  . calcium-vitamin D (OSCAL WITH D) 500-200 MG-UNIT tablet Take 1 tablet by mouth 2 (two) times daily.  Marland Kitchen ELIQUIS 5 MG TABS tablet Take 1 tablet (5 mg total) by mouth 2 (two) times daily.  . furosemide (LASIX) 20 MG tablet TAKE 1 TABLET BY MOUTH DAILY. TAKE AN EXTRA TABLET IF YOU WEIGHT 138 OR GREATER  . levothyroxine (SYNTHROID, LEVOTHROID) 75 MCG tablet Take 75 mcg by mouth daily.  . metoprolol tartrate (LOPRESSOR) 50 MG tablet Take 1 tablet (50 mg total) by mouth 2 (two) times daily.  . Multiple Vitamin (MULTI-VITAMINS) TABS Take 1 tablet by mouth daily.  . Omega-3 Fatty Acids (FISH OIL) 1000 MG CAPS Take 2 capsules by mouth 2 (two) times daily.  . pantoprazole (PROTONIX) 40 MG tablet TK 1 T PO D  . pravastatin (PRAVACHOL) 40 MG tablet Take 1 tablet (40 mg total) by mouth daily.  . sacubitril-valsartan (ENTRESTO) 49-51 MG Take 1 tablet by mouth 2 (two) times daily.  . [DISCONTINUED] ELIQUIS 5 MG TABS tablet Take 1 tablet (5 mg total) by mouth 2 (two) times daily.  . [DISCONTINUED] ELIQUIS 5 MG TABS tablet Take 1 tablet (5 mg total) by mouth 2 (two) times daily.  . [DISCONTINUED] furosemide (LASIX) 20 MG tablet TAKE 1 TABLET BY MOUTH DAILY. TAKE AN EXTRA TABLET IF YOU WEIGHT 138 OR GREATER  . [DISCONTINUED] metoprolol tartrate (LOPRESSOR) 50 MG tablet Take 1 tablet (50 mg total) by mouth 2 (two) times daily.  . [DISCONTINUED] pravastatin (PRAVACHOL) 40 MG tablet Take 1 tablet (40 mg total) by mouth daily.  . [DISCONTINUED] sacubitril-valsartan (ENTRESTO) 49-51 MG Take 1 tablet by mouth 2 (two) times daily.     Allergies:   Sulfamethoxazole-trimethoprim, Clindamycin, Nitrofurantoin, Penicillins, Prednisone, and Latex   Social History   Socioeconomic History  . Marital  status: Married    Spouse name: Not on file  . Number of children: Not on file  . Years of education: Not on file  . Highest education level: Not on file  Occupational History  . Not on file  Tobacco Use  . Smoking status: Never Smoker  . Smokeless tobacco: Never Used  Substance and Sexual Activity  . Alcohol use: No  . Drug use: No  . Sexual activity: Not on file  Other Topics Concern  . Not on file  Social History Narrative  . Not on file  Social Determinants of Health   Financial Resource Strain:   . Difficulty of Paying Living Expenses: Not on file  Food Insecurity:   . Worried About Charity fundraiser in the Last Year: Not on file  . Ran Out of Food in the Last Year: Not on file  Transportation Needs:   . Lack of Transportation (Medical): Not on file  . Lack of Transportation (Non-Medical): Not on file  Physical Activity:   . Days of Exercise per Week: Not on file  . Minutes of Exercise per Session: Not on file  Stress:   . Feeling of Stress : Not on file  Social Connections:   . Frequency of Communication with Friends and Family: Not on file  . Frequency of Social Gatherings with Friends and Family: Not on file  . Attends Religious Services: Not on file  . Active Member of Clubs or Organizations: Not on file  . Attends Archivist Meetings: Not on file  . Marital Status: Not on file     Family History: The patient's family history includes Breast cancer in her sister and sister; Hyperlipidemia in her sister and sister. ROS:   Please see the history of present illness.    All other systems reviewed and are negative.  EKGs/Labs/Other Studies Reviewed:    The following studies were reviewed today:  EKG:  EKG ordered today and personally reviewed.  The ekg ordered today demonstrates atrial fibrillation controlled rate  Recent Labs: 08/18/2018 cholesterol 239 LDL 161 HDL 55 creatinine 1.0  Physical Exam:    VS:  BP 124/72   Pulse 73   Ht 5\' 5"   (1.651 m)   Wt 145 lb 3.2 oz (65.9 kg)   LMP  (LMP Unknown)   SpO2 98%   BMI 24.16 kg/m     Wt Readings from Last 3 Encounters:  02/15/19 145 lb 3.2 oz (65.9 kg)  08/17/18 143 lb (64.9 kg)  02/02/18 140 lb 9.6 oz (63.8 kg)     GEN:  Well nourished, well developed in no acute distress HEENT: Normal NECK: No JVD; No carotid bruits LYMPHATICS: No lymphadenopathy CARDIAC: Irregular S1 variable soft grade 1/6 apical MR no, rubs, gallops RESPIRATORY:  Clear to auscultation without rales, wheezing or rhonchi  ABDOMEN: Soft, non-tender, non-distended MUSCULOSKELETAL:  No edema; No deformity  SKIN: Warm and dry NEUROLOGIC:  Alert and oriented x 3 PSYCHIATRIC:  Normal affect    Signed, Shirlee More, MD  02/15/2019 11:35 AM    [place]

## 2019-02-15 ENCOUNTER — Encounter: Payer: Self-pay | Admitting: Cardiology

## 2019-02-15 ENCOUNTER — Other Ambulatory Visit: Payer: Self-pay

## 2019-02-15 ENCOUNTER — Ambulatory Visit (INDEPENDENT_AMBULATORY_CARE_PROVIDER_SITE_OTHER): Payer: Medicare Other | Admitting: Cardiology

## 2019-02-15 VITALS — BP 124/72 | HR 73 | Ht 65.0 in | Wt 145.2 lb

## 2019-02-15 DIAGNOSIS — I110 Hypertensive heart disease with heart failure: Secondary | ICD-10-CM | POA: Diagnosis not present

## 2019-02-15 DIAGNOSIS — I5022 Chronic systolic (congestive) heart failure: Secondary | ICD-10-CM | POA: Diagnosis not present

## 2019-02-15 DIAGNOSIS — Z7901 Long term (current) use of anticoagulants: Secondary | ICD-10-CM

## 2019-02-15 DIAGNOSIS — I482 Chronic atrial fibrillation, unspecified: Secondary | ICD-10-CM

## 2019-02-15 DIAGNOSIS — I5042 Chronic combined systolic (congestive) and diastolic (congestive) heart failure: Secondary | ICD-10-CM | POA: Diagnosis not present

## 2019-02-15 DIAGNOSIS — I2510 Atherosclerotic heart disease of native coronary artery without angina pectoris: Secondary | ICD-10-CM | POA: Diagnosis not present

## 2019-02-15 LAB — LIPID PANEL
Chol/HDL Ratio: 4 ratio (ref 0.0–4.4)
Cholesterol, Total: 234 mg/dL — ABNORMAL HIGH (ref 100–199)
HDL: 59 mg/dL (ref >39)
LDL Chol Calc (NIH): 146 mg/dL — ABNORMAL HIGH (ref 0–99)
Triglycerides: 160 mg/dL — ABNORMAL HIGH (ref 0–149)
VLDL Cholesterol Cal: 29 mg/dL (ref 5–40)

## 2019-02-15 LAB — TSH+T4F+T3FREE
Free T4: 1.85 ng/dL — ABNORMAL HIGH (ref 0.82–1.77)
T3, Free: 2.7 pg/mL (ref 2.0–4.4)
TSH: 0.666 u[IU]/mL (ref 0.450–4.500)

## 2019-02-15 LAB — COMPREHENSIVE METABOLIC PANEL
ALT: 22 IU/L (ref 0–32)
AST: 18 IU/L (ref 0–40)
Albumin/Globulin Ratio: 2 (ref 1.2–2.2)
Albumin: 4.8 g/dL — ABNORMAL HIGH (ref 3.7–4.7)
Alkaline Phosphatase: 58 IU/L (ref 39–117)
BUN/Creatinine Ratio: 18 (ref 12–28)
BUN: 19 mg/dL (ref 8–27)
Bilirubin Total: 0.9 mg/dL (ref 0.0–1.2)
CO2: 22 mmol/L (ref 20–29)
Calcium: 10 mg/dL (ref 8.7–10.3)
Chloride: 104 mmol/L (ref 96–106)
Creatinine, Ser: 1.07 mg/dL — ABNORMAL HIGH (ref 0.57–1.00)
GFR calc Af Amer: 60 mL/min/{1.73_m2} (ref >59)
GFR calc non Af Amer: 52 mL/min/{1.73_m2} — ABNORMAL LOW (ref >59)
Globulin, Total: 2.4 g/dL (ref 1.5–4.5)
Glucose: 89 mg/dL (ref 65–99)
Potassium: 4.5 mmol/L (ref 3.5–5.2)
Sodium: 143 mmol/L (ref 134–144)
Total Protein: 7.2 g/dL (ref 6.0–8.5)

## 2019-02-15 LAB — PRO B NATRIURETIC PEPTIDE: NT-Pro BNP: 2144 pg/mL — ABNORMAL HIGH (ref 0–301)

## 2019-02-15 MED ORDER — METOPROLOL TARTRATE 50 MG PO TABS: 50.0000 mg | ORAL_TABLET | Freq: Two times a day (BID) | ORAL | 1 refills | Status: DC

## 2019-02-15 MED ORDER — ELIQUIS 5 MG PO TABS: 5.0000 mg | ORAL_TABLET | Freq: Two times a day (BID) | ORAL | 3 refills | Status: DC

## 2019-02-15 MED ORDER — FUROSEMIDE 20 MG PO TABS: ORAL_TABLET | ORAL | 3 refills | Status: DC

## 2019-02-15 MED ORDER — ENTRESTO 49-51 MG PO TABS: 1.0000 | ORAL_TABLET | Freq: Two times a day (BID) | ORAL | 3 refills | Status: DC

## 2019-02-15 MED ORDER — PRAVASTATIN SODIUM 40 MG PO TABS: 40.0000 mg | ORAL_TABLET | Freq: Every day | ORAL | 2 refills | Status: DC

## 2019-02-15 NOTE — Patient Instructions (Signed)
Medication Instructions:  Your physician recommends that you continue on your current medications as directed. Please refer to the Current Medication list given to you today.  *If you need a refill on your cardiac medications before your next appointment, please call your pharmacy*  Lab Work: Your physician recommends that you have a CMP, lipid, BNP and TSH drawn today  If you have labs (blood work) drawn today and your tests are completely normal, you will receive your results only by: Marland Kitchen MyChart Message (if you have MyChart) OR . A paper copy in the mail If you have any lab test that is abnormal or we need to change your treatment, we will call you to review the results.  Testing/Procedures: You had an EKG performed today  Follow-Up: At [facility], you and your health needs are our priority.  As part of our continuing mission to provide you with exceptional heart care, we have created designated Provider Care Teams.  These Care Teams include your primary Cardiologist (physician) and Advanced Practice Providers (APPs -  Physician Assistants and Nurse Practitioners) who all work together to provide you with the care you need, when you need it.  Your next appointment:   6 month(s)  The format for your next appointment:   In Person  Provider:   Shirlee More, MD

## 2019-02-26 ENCOUNTER — Other Ambulatory Visit: Payer: Self-pay | Admitting: Cardiology

## 2019-02-26 DIAGNOSIS — I5042 Chronic combined systolic (congestive) and diastolic (congestive) heart failure: Secondary | ICD-10-CM

## 2019-03-01 ENCOUNTER — Telehealth: Payer: Self-pay | Admitting: Cardiology

## 2019-03-01 NOTE — Telephone Encounter (Signed)
Patient would like a paper copy of her recent lab results mailed to her. She meant to ask when the office called her to go over said results but forgot. Her address in her chart has been verified.

## 2019-03-01 NOTE — Telephone Encounter (Signed)
Telephone call to patient.. Informed of new policy of being unable to mail lab results without a signed release. She states this is fine and will come by tomorrow to sign the release.

## 2019-03-02 DIAGNOSIS — H40003 Preglaucoma, unspecified, bilateral: Secondary | ICD-10-CM | POA: Diagnosis not present

## 2019-04-29 ENCOUNTER — Telehealth: Payer: Self-pay | Admitting: Cardiology

## 2019-04-29 ENCOUNTER — Other Ambulatory Visit: Payer: Self-pay | Admitting: Cardiology

## 2019-04-29 NOTE — Telephone Encounter (Signed)
New message   Patient needs a new prescription for metoprolol tartrate (LOPRESSOR) 50 MG tablet sent to [facility], [place]

## 2019-05-02 ENCOUNTER — Other Ambulatory Visit: Payer: Self-pay

## 2019-05-02 MED ORDER — METOPROLOL TARTRATE 50 MG PO TABS: 50.0000 mg | ORAL_TABLET | Freq: Two times a day (BID) | ORAL | 1 refills | Status: DC

## 2019-05-02 NOTE — Telephone Encounter (Signed)
RX for metoprolol sent in to the pharmacy per Pt request.

## 2019-05-17 DIAGNOSIS — Z86000 Personal history of in-situ neoplasm of breast: Secondary | ICD-10-CM | POA: Diagnosis not present

## 2019-07-05 DIAGNOSIS — M10071 Idiopathic gout, right ankle and foot: Secondary | ICD-10-CM | POA: Diagnosis not present

## 2019-07-12 DIAGNOSIS — M10071 Idiopathic gout, right ankle and foot: Secondary | ICD-10-CM | POA: Insufficient documentation

## 2019-07-12 HISTORY — DX: Idiopathic gout, right ankle and foot: M10.071

## 2019-07-22 DIAGNOSIS — M1310 Monoarthritis, not elsewhere classified, unspecified site: Secondary | ICD-10-CM | POA: Diagnosis not present

## 2019-07-22 DIAGNOSIS — E790 Hyperuricemia without signs of inflammatory arthritis and tophaceous disease: Secondary | ICD-10-CM | POA: Diagnosis not present

## 2019-07-22 DIAGNOSIS — Z6824 Body mass index (BMI) 24.0-24.9, adult: Secondary | ICD-10-CM | POA: Diagnosis not present

## 2019-08-17 NOTE — Progress Notes (Signed)
Cardiology Office Note:    Date:  08/18/2019   ID:  Gloria Harrington, DOB [id number], MRN [id number]  PCP:  Mateo Flow, MD  Cardiologist:  Shirlee More, MD    Referring MD: Mateo Flow, MD   she will see you next week for wellness exam.  I would like to see her have a chest x-ray done with her recent pleuritic chest pain and it labs to be done in your office please check CMP lipid profile and a proBNP level with a copy to me   ASSESSMENT:    1. Hypertensive heart disease with chronic systolic congestive heart failure ([place])   2. Chronic combined systolic and diastolic heart failure ([place])   3. Chronic atrial fibrillation ([place])   4. Chronic anticoagulation   5. Hyperlipidemia, unspecified hyperlipidemia type   6. Drug-induced hyperkalemia    PLAN:    In order of problems listed above:  1. Stable her blood pressure is at target heart failure is compensated she will continue her small dose of loop diuretic therapy beta-blocker and Entresto with normalization of her ejection fraction.  Send a note to her PCP and asked with labs done next week to get a proBNP level. 2. Stable rate is controlled continue beta-blocker and her current anticoagulant without bleeding complication 3. Poorly statin intolerant continue pravastatin labs to be done next week with her primary care doctor 4. Recheck renal function potassium next week 5. About a month ago she had pleuritic chest pain for assurance we will check an EKG in my office before she leaves on give her a copy to hand carry for exam next week and asked her to get her chest x-ray done with a wellness exam next week at [facility] family practice   Next appointment: 6 months   Medication Adjustments/Labs and Tests Ordered: Current medicines are reviewed at length with the patient today.  Concerns regarding medicines are outlined above.  Orders Placed This Encounter  Procedures  . EKG 12-Lead   No orders of the defined types  were placed in this encounter.   Chief Complaint  Patient presents with  . Follow-up  . Congestive Heart Failure  . Atrial Fibrillation    History of Present Illness:    Gloria Harrington is a 74 y.o. female with a hx of  hypertensive heart disease with heart failure EF 40%, Permanent Atrial Fibrillation with anticoagulation, Dyslipidemia, and hyperkalemia with ACEI. Echo 02/08/2018 with EF 55-60% mild LAE mild MR  last seen 02/15/2019. Compliance with diet, lifestyle and medications: Yes  Much of this visit centered upon her in decision about COVID-19 immunization and after long discussion I think she will go and get it.  In general she is done well but a month ago she was having pleuritic chest pain for a day and a cough no fever or chill.  I have asked her for further evaluation to have a chest x-ray done and she said she will have it next week at [facility] family practice she has a health physical.  She tolerates her statin without muscle pain or weakness [facility] and [facility] and at this time is having no exercise intolerance anginal discomfort shortness of breath or edema.  Coagulated without bleeding complication Past Medical History:  Diagnosis Date  . 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 Chronic anticoagulation 08/14/2014  . Coronary artery calcification seen on CT scan 08/14/2014  .  Essential hypertension 08/14/2014  . Hyperkalemia 03/24/2016  . Hyperlipidemia 08/14/2014  . Hypothyroid 09/17/2016  . PAF (paroxysmal atrial fibrillation) ([place]) 08/14/2014   Overview:  CHADS2 vasc score=4  . Palpitation 09/17/2016    Past Surgical History:  Procedure Laterality Date  . ARTHROPLASTY     for hammertoe  . BREAST BIOPSY    . HALLUX VALGUS CORRECTION    . open tenotomy of extensor second toe    . TUBAL LIGATION      Current Medications: Current Meds  Medication Sig  . calcium-vitamin D (OSCAL WITH D) 500-200 MG-UNIT tablet Take 1  tablet by mouth 2 (two) times daily.  Marland Kitchen ELIQUIS 5 MG TABS tablet Take 1 tablet (5 mg total) by mouth 2 (two) times daily.  . furosemide (LASIX) 20 MG tablet TAKE 1 TABLET BY MOUTH DAILY. TAKE AN EXTRA TABLET IF YOU WEIGHT 138 OR GREATER  . levothyroxine (SYNTHROID, LEVOTHROID) 75 MCG tablet Take 75 mcg by mouth daily.  . metoprolol tartrate (LOPRESSOR) 50 MG tablet Take 1 tablet (50 mg total) by mouth 2 (two) times daily.  . Multiple Vitamin (MULTI-VITAMINS) TABS Take 1 tablet by mouth daily.  . Omega-3 Fatty Acids (FISH OIL) 1000 MG CAPS Take 2 capsules by mouth 2 (two) times daily.  . pantoprazole (PROTONIX) 40 MG tablet TK 1 T PO D  . pravastatin (PRAVACHOL) 40 MG tablet Take 1 tablet (40 mg total) by mouth daily.  . sacubitril-valsartan (ENTRESTO) 49-51 MG Take 1 tablet by mouth 2 (two) times daily.     Allergies:   Sulfamethoxazole-trimethoprim, Clindamycin, Nitrofurantoin, Penicillins, Prednisone, and Latex   Social History   Socioeconomic History  . Marital status: Married    Spouse name: Not on file  . Number of children: Not on file  . Years of education: Not on file  . Highest education level: Not on file  Occupational History  . Not on file  Tobacco Use  . Smoking status: Never Smoker  . Smokeless tobacco: Never Used  Vaping Use  . Vaping Use: Never used  Substance and Sexual Activity  . Alcohol use: No  . Drug use: No  . Sexual activity: Not on file  Other Topics Concern  . Not on file  Social History Narrative  . Not on file   Social Determinants of Health   Financial Resource Strain:   . Difficulty of Paying Living Expenses:   Food Insecurity:   . Worried About Charity fundraiser in the Last Year:   . Arboriculturist in the Last Year:   Transportation Needs:   . Film/video editor (Medical):   Marland Kitchen Lack of Transportation (Non-Medical):   Physical Activity:   . Days of Exercise per Week:   . Minutes of Exercise per Session:   Stress:   . Feeling of  Stress :   Social Connections:   . Frequency of Communication with Friends and Family:   . Frequency of Social Gatherings with Friends and Family:   . Attends Religious Services:   . Active Member of Clubs or Organizations:   . Attends Archivist Meetings:   Marland Kitchen Marital Status:      Family History: The patient's family history includes Breast cancer in her sister and sister; Hyperlipidemia in her sister and sister. ROS:   Please see the history of present illness.    All other systems reviewed and are negative.  EKGs/Labs/Other Studies Reviewed:    The following studies were reviewed today:  Recent Labs: 02/15/2019: ALT 22; BUN 19; Creatinine, Ser 1.07; NT-Pro BNP 2,144; Potassium 4.5; Sodium 143; TSH 0.666  Recent Lipid Panel    Component Value Date/Time   CHOL 234 (H) 02/15/2019 1130   TRIG 160 (H) 02/15/2019 1130   HDL 59 02/15/2019 1130   CHOLHDL 4.0 02/15/2019 1130   LDLCALC 146 (H) 02/15/2019 1130    Physical Exam:    VS:  BP 134/64   Pulse 88   Ht 5\' 5"  (1.651 m)   Wt 143 lb (64.9 kg)   LMP  (LMP Unknown)   SpO2 96%   BMI 23.80 kg/m     Wt Readings from Last 3 Encounters:  08/18/19 143 lb (64.9 kg)  02/15/19 145 lb 3.2 oz (65.9 kg)  08/17/18 143 lb (64.9 kg)     GEN: She does not look chronically ill well nourished, well developed in no acute distress HEENT: Normal NECK: No JVD; No carotid bruits LYMPHATICS: No lymphadenopathy CARDIAC: Irregular S1 variable 1 of 6 apical MRno rubs, gallops RESPIRATORY:  Clear to auscultation without rales, wheezing or rhonchi  ABDOMEN: Soft, non-tender, non-distended MUSCULOSKELETAL:  No edema; No deformity  SKIN: Warm and dry NEUROLOGIC:  Alert and oriented x 3 PSYCHIATRIC:  Normal affect    Signed, Shirlee More, MD  08/18/2019 11:40 AM    [place]

## 2019-08-18 ENCOUNTER — Other Ambulatory Visit: Payer: Self-pay

## 2019-08-18 ENCOUNTER — Encounter: Payer: Self-pay | Admitting: Cardiology

## 2019-08-18 ENCOUNTER — Ambulatory Visit: Payer: Medicare Other | Admitting: Cardiology

## 2019-08-18 VITALS — BP 134/64 | HR 88 | Ht 65.0 in | Wt 143.0 lb

## 2019-08-18 DIAGNOSIS — Z7901 Long term (current) use of anticoagulants: Secondary | ICD-10-CM | POA: Diagnosis not present

## 2019-08-18 DIAGNOSIS — E875 Hyperkalemia: Secondary | ICD-10-CM

## 2019-08-18 DIAGNOSIS — I5042 Chronic combined systolic (congestive) and diastolic (congestive) heart failure: Secondary | ICD-10-CM | POA: Diagnosis not present

## 2019-08-18 DIAGNOSIS — I110 Hypertensive heart disease with heart failure: Secondary | ICD-10-CM | POA: Diagnosis not present

## 2019-08-18 DIAGNOSIS — E785 Hyperlipidemia, unspecified: Secondary | ICD-10-CM

## 2019-08-18 DIAGNOSIS — T50905A Adverse effect of unspecified drugs, medicaments and biological substances, initial encounter: Secondary | ICD-10-CM

## 2019-08-18 DIAGNOSIS — I482 Chronic atrial fibrillation, unspecified: Secondary | ICD-10-CM

## 2019-08-18 DIAGNOSIS — I5022 Chronic systolic (congestive) heart failure: Secondary | ICD-10-CM

## 2019-08-18 NOTE — Patient Instructions (Signed)
Medication Instructions:  Your physician recommends that you continue on your current medications as directed. Please refer to the Current Medication list given to you today.  *If you need a refill on your cardiac medications before your next appointment, please call your pharmacy*   Lab Work: None If you have labs (blood work) drawn today and your tests are completely normal, you will receive your results only by: Marland Kitchen MyChart Message (if you have MyChart) OR . A paper copy in the mail If you have any lab test that is abnormal or we need to change your treatment, we will call you to review the results.   Testing/Procedures: Please request a chest x-ray to be completed at [facility] at your next visit.    Follow-Up: At [facility], you and your health needs are our priority.  As part of our continuing mission to provide you with exceptional heart care, we have created designated Provider Care Teams.  These Care Teams include your primary Cardiologist (physician) and Advanced Practice Providers (APPs -  Physician Assistants and Nurse Practitioners) who all work together to provide you with the care you need, when you need it.  We recommend signing up for the patient portal called "MyChart".  Sign up information is provided on this After Visit Summary.  MyChart is used to connect with patients for Virtual Visits (Telemedicine).  Patients are able to view lab/test results, encounter notes, upcoming appointments, etc.  Non-urgent messages can be sent to your provider as well.   To learn more about what you can do with MyChart, go to [email or web address].    Your next appointment:   6 month(s)  The format for your next appointment:   In Person  Provider:   Shirlee More, MD   Other Instructions

## 2019-08-23 DIAGNOSIS — E782 Mixed hyperlipidemia: Secondary | ICD-10-CM | POA: Diagnosis not present

## 2019-08-23 DIAGNOSIS — I509 Heart failure, unspecified: Secondary | ICD-10-CM | POA: Diagnosis not present

## 2019-08-23 DIAGNOSIS — Z0000 Encounter for general adult medical examination without abnormal findings: Secondary | ICD-10-CM | POA: Diagnosis not present

## 2019-08-23 DIAGNOSIS — E039 Hypothyroidism, unspecified: Secondary | ICD-10-CM | POA: Diagnosis not present

## 2019-08-23 DIAGNOSIS — I10 Essential (primary) hypertension: Secondary | ICD-10-CM | POA: Diagnosis not present

## 2019-08-23 DIAGNOSIS — Z79899 Other long term (current) drug therapy: Secondary | ICD-10-CM | POA: Diagnosis not present

## 2019-09-01 ENCOUNTER — Telehealth: Payer: Self-pay | Admitting: Cardiology

## 2019-09-01 NOTE — Telephone Encounter (Signed)
Patient states that [facility] was to fax over her lab work because Dr. Bettina Gavia had some of the labs that he needed her to get done faxed to that office for her to do. She states the results came in and said her heart function was mildly elevated. She wants to make sure Dr. Bettina Gavia received these results and wants to know where he would like to go from here.

## 2019-09-01 NOTE — Telephone Encounter (Signed)
Spoke to the patient just now and let her know that we have not received any lab results from [facility]. I gave her our fax number to have them fax the results to and she states that she will do this. No other issues or concerns were noted at this time.    Encouraged patient to call back with any questions or concerns.

## 2019-09-02 NOTE — Telephone Encounter (Signed)
Spoke to the patient just now and let her know that Dr. Bettina Gavia did look over her lab work from her PCP office and reviewed it. He said that her lab results were great. She verbalizes understanding and thanks me for the call back.    Encouraged patient to call back with any questions or concerns.

## 2019-09-07 DIAGNOSIS — D3132 Benign neoplasm of left choroid: Secondary | ICD-10-CM | POA: Diagnosis not present

## 2019-09-07 DIAGNOSIS — H40003 Preglaucoma, unspecified, bilateral: Secondary | ICD-10-CM | POA: Diagnosis not present

## 2019-09-07 DIAGNOSIS — H2513 Age-related nuclear cataract, bilateral: Secondary | ICD-10-CM | POA: Diagnosis not present

## 2019-09-07 DIAGNOSIS — H524 Presbyopia: Secondary | ICD-10-CM | POA: Diagnosis not present

## 2019-10-14 ENCOUNTER — Other Ambulatory Visit: Payer: Self-pay | Admitting: Cardiology

## 2019-10-14 DIAGNOSIS — I5042 Chronic combined systolic (congestive) and diastolic (congestive) heart failure: Secondary | ICD-10-CM

## 2019-11-03 DIAGNOSIS — N6489 Other specified disorders of breast: Secondary | ICD-10-CM | POA: Diagnosis not present

## 2019-11-03 DIAGNOSIS — D0512 Intraductal carcinoma in situ of left breast: Secondary | ICD-10-CM | POA: Diagnosis not present

## 2019-11-05 ENCOUNTER — Other Ambulatory Visit: Payer: Self-pay | Admitting: Cardiology

## 2019-11-08 DIAGNOSIS — Z853 Personal history of malignant neoplasm of breast: Secondary | ICD-10-CM | POA: Diagnosis not present

## 2019-11-22 DIAGNOSIS — K589 Irritable bowel syndrome without diarrhea: Secondary | ICD-10-CM | POA: Diagnosis not present

## 2019-11-22 DIAGNOSIS — Z8719 Personal history of other diseases of the digestive system: Secondary | ICD-10-CM | POA: Diagnosis not present

## 2019-11-27 ENCOUNTER — Other Ambulatory Visit: Payer: Self-pay | Admitting: Cardiology

## 2019-11-28 ENCOUNTER — Other Ambulatory Visit: Payer: Self-pay

## 2019-11-28 NOTE — Progress Notes (Signed)
Entresto 49-51 mg #180 tablets with 3 additional refills request faxed to [facility] by [employer] [phone]. Rx # [id number].   Patient get assistance for this medication through company

## 2019-11-29 ENCOUNTER — Telehealth: Payer: Self-pay | Admitting: *Deleted

## 2019-11-29 MED ORDER — ENTRESTO 49-51 MG PO TABS: 1.0000 | ORAL_TABLET | Freq: Two times a day (BID) | ORAL | 0 refills | Status: DC

## 2019-11-29 NOTE — Telephone Encounter (Signed)
Rx refill sent to pharmacy.  1st attempt printed. Sent this one electronically

## 2019-11-29 NOTE — Telephone Encounter (Signed)
Rx refill sent to pharmacy. 

## 2019-12-27 ENCOUNTER — Telehealth: Payer: Self-pay | Admitting: Cardiology

## 2019-12-27 NOTE — Telephone Encounter (Signed)
Patient states that she dropped off a Ecologist for [employer] and a [employer] for Eliquis on 12/21/19 for Dr. Bettina Gavia to fill out and fax back to them. She states that as of today they still have not received anything on our end and need this filled out and faxed to them soon. Please advise   [facility] fax: [phone]  [employer] fax: [phone]

## 2019-12-27 NOTE — Telephone Encounter (Signed)
Spoke to the patient just now and I let her know that I will look for these forms when I come back to the [place] office tomorrow. She states that she has filled out her part and has already sent it back in but is needing Dr. Bettina Gavia to fill out his part and fax it to them. I told her that I can actually pull up the patient assistance form from here and will fax our part back for her now. She verbalizes understanding and thanks me for the call back.

## 2019-12-28 DIAGNOSIS — Z23 Encounter for immunization: Secondary | ICD-10-CM | POA: Diagnosis not present

## 2019-12-30 ENCOUNTER — Other Ambulatory Visit: Payer: Self-pay

## 2019-12-30 DIAGNOSIS — I482 Chronic atrial fibrillation, unspecified: Secondary | ICD-10-CM

## 2019-12-30 MED ORDER — ELIQUIS 5 MG PO TABS: 5.0000 mg | ORAL_TABLET | Freq: Two times a day (BID) | ORAL | 0 refills | Status: DC

## 2019-12-30 NOTE — Telephone Encounter (Signed)
Prescription refill request for Eliquis received. Indication:  A fib Last office visit: 08/18/19 Scr: 1.07 Age: 74 Weight: 64kg

## 2020-01-02 ENCOUNTER — Other Ambulatory Visit: Payer: Self-pay

## 2020-01-02 DIAGNOSIS — I482 Chronic atrial fibrillation, unspecified: Secondary | ICD-10-CM

## 2020-01-02 MED ORDER — ELIQUIS 5 MG PO TABS: 5.0000 mg | ORAL_TABLET | Freq: Two times a day (BID) | ORAL | 1 refills | Status: DC

## 2020-01-10 ENCOUNTER — Telehealth: Payer: Self-pay | Admitting: Cardiology

## 2020-01-10 NOTE — Telephone Encounter (Signed)
New Message:     Pt wants Lequita Halt to call her asap please. It is concerning her Entresto application.

## 2020-01-10 NOTE — Telephone Encounter (Signed)
Spoke to the patient just now and she let me know that she spoke with [employer] today and they told her that her application was cut off at the bottom and did not come through the fax machine properly. She requested that I please refax it for her which I will do at this time.    Encouraged patient to call back with any questions or concerns.

## 2020-02-13 NOTE — Progress Notes (Unsigned)
Cardiology Office Note:    Date:  02/14/2020   ID:  Gloria Harrington, DOB [birth year], MRN [id number]  PCP:  Mateo Flow, MD  Cardiologist:  Shirlee More, MD    Referring MD: Mateo Flow, MD    ASSESSMENT:    1. Permanent atrial fibrillation ([place])   2. Hyperlipidemia, unspecified hyperlipidemia type   3. Shortness of breath    PLAN:    In order of problems listed above:  1. She continues to do well with her chronic atrial fibrillation for all purposes asymptomatic rate is controlled with her beta-blocker and anticoagulated.  She had cardiomyopathy in the past we will recheck her echocardiogram for ejection fraction and continue guideline directed therapy including the maximally tolerated dose of Entresto along with her low-dose loop diuretic for heart failure. 2. Continue anticoagulant both moderate stroke risk 3. Continue her statin recheck labs including CMP proBNP and a lipid profile   Next appointment: 6 months   Medication Adjustments/Labs and Tests Ordered: Current medicines are reviewed at length with the patient today.  Concerns regarding medicines are outlined above.  Orders Placed This Encounter  Procedures  . Pro b natriuretic peptide (BNP)  . Lipid panel  . Comprehensive metabolic panel  . ECHOCARDIOGRAM COMPLETE   No orders of the defined types were placed in this encounter.   Chief Complaint  Patient presents with  . Follow-up    History of Present Illness:    Gloria Harrington is a 75 y.o. 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  She was last seen 08/18/2019. Compliance with diet, lifestyle and medications: Yes  She is doing well [facility] has a very mild shortness of breath at times when she does quick activities like  climbing stairs but not when she exercises no edema orthopnea chest pain palpitation or syncope. She inquires about a follow-up echocardiogram she is 2 years out I have been hesitant to ordering Covid but will order after today's visit. She tolerates her anticoagulant without any bleeding complication She tolerates her statin without muscle pain or weakness and takes over-the-counter fish oil. Past Medical History:  Diagnosis Date  . Breast cancer ([place]) 10/28/2016   Ductal carcinoma of left breast  . Chronic anticoagulation 08/14/2014  . Coronary artery calcification seen on CT scan 08/14/2014  . Essential hypertension 08/14/2014  . Hyperkalemia 03/24/2016  . Hyperlipidemia 08/14/2014  . Hypothyroid 09/17/2016  . PAF (paroxysmal atrial fibrillation) ([place]) 08/14/2014   Overview:  CHADS2 vasc score=4  . Palpitation 09/17/2016    Past Surgical History:  Procedure Laterality Date  . ARTHROPLASTY     for hammertoe  . BREAST BIOPSY    . HALLUX VALGUS CORRECTION    . open tenotomy of extensor second toe    . TUBAL LIGATION      Current Medications: Current Meds  Medication Sig  . calcium-vitamin D (OSCAL WITH D) 500-200 MG-UNIT tablet Take 1 tablet by mouth 2 (two) times daily.  Marland Kitchen dicyclomine (BENTYL) 10 MG capsule Take 10 mg by mouth 2 (two) times daily as needed for pain.  Marland Kitchen ELIQUIS 5 MG TABS tablet Take 1 tablet (5 mg total) by mouth 2 (two) times daily.  . furosemide (LASIX) 20 MG tablet TAKE 1 TABLET BY MOUTH DAILY. TAKE AN EXTRA TABLET  IF YOU WEIGHT 138 OR GREATER  . levothyroxine (SYNTHROID, LEVOTHROID) 75 MCG tablet Take 75 mcg by mouth daily.  . metoprolol tartrate (LOPRESSOR) 50 MG tablet TAKE 1 TABLET(50 MG) BY MOUTH TWICE DAILY  . Multiple Vitamin (MULTI-VITAMINS) TABS Take 1 tablet by mouth daily.  . Omega-3 Fatty Acids (FISH OIL) 1000 MG CAPS Take 2 capsules by mouth 2 (two) times daily.  . pantoprazole (PROTONIX) 40 MG tablet Take 40 mg by mouth daily.  . pravastatin (PRAVACHOL) 40  MG tablet TAKE 1 TABLET(40 MG) BY MOUTH DAILY  . sacubitril-valsartan (ENTRESTO) 49-51 MG Take 1 tablet by mouth 2 (two) times daily.     Allergies:   Sulfamethoxazole-trimethoprim, Clindamycin, Nitrofurantoin, Penicillins, Prednisone, and Latex   Social History   Socioeconomic History  . Marital status: Married    Spouse name: Not on file  . Number of children: Not on file  . Years of education: Not on file  . Highest education level: Not on file  Occupational History  . Not on file  Tobacco Use  . Smoking status: Never Smoker  . Smokeless tobacco: Never Used  Vaping Use  . Vaping Use: Never used  Substance and Sexual Activity  . Alcohol use: No  . Drug use: No  . Sexual activity: Not on file  Other Topics Concern  . Not on file  Social History Narrative  . Not on file   Social Determinants of Health   Financial Resource Strain: Not on file  Food Insecurity: Not on file  Transportation Needs: Not on file  Physical Activity: Not on file  Stress: Not on file  Social Connections: Not on file     Family History: The patient's family history includes Breast cancer in her sister and sister; Hyperlipidemia in her sister and sister. ROS:   Please see the history of present illness.    All other systems reviewed and are negative.  EKGs/Labs/Other Studies Reviewed:    The following studies were reviewed today:   Recent Labs: 02/15/2019: ALT 22; BUN 19; Creatinine, Ser 1.07; NT-Pro BNP 2,144; Potassium 4.5; Sodium 143; TSH 0.666  Recent Lipid Panel    Component Value Date/Time   CHOL 234 (H) 02/15/2019 1130   TRIG 160 (H) 02/15/2019 1130   HDL 59 02/15/2019 1130   CHOLHDL 4.0 02/15/2019 1130   LDLCALC 146 (H) 02/15/2019 1130    Physical Exam:    VS:  BP 100/70 (BP Location: Right Arm, Patient Position: Sitting, Cuff Size: Normal)   Pulse 76   Ht 5\' 5"  (1.651 m)   Wt 148 lb (67.1 kg)   LMP  (LMP Unknown)   SpO2 99%   BMI 24.63 kg/m     Wt Readings from  Last 3 Encounters:  02/14/20 148 lb (67.1 kg)  08/18/19 143 lb (64.9 kg)  02/15/19 145 lb 3.2 oz (65.9 kg)     GEN: Does not look ill appears her age well nourished, well developed in no acute distress HEENT: Normal NECK: No JVD; No carotid bruits LYMPHATICS: No lymphadenopathy CARDIAC: Irregular S1 is variable no murmurs, rubs, gallops RESPIRATORY:  Clear to auscultation without rales, wheezing or rhonchi  ABDOMEN: Soft, non-tender, non-distended MUSCULOSKELETAL:  No edema; No deformity  SKIN: Warm and dry NEUROLOGIC:  Alert and oriented x 3 PSYCHIATRIC:  Normal affect    Signed, Shirlee More, MD  02/14/2020 10:18 AM    [facility]

## 2020-02-14 ENCOUNTER — Ambulatory Visit: Payer: Medicare Other | Admitting: Cardiology

## 2020-02-14 ENCOUNTER — Other Ambulatory Visit: Payer: Self-pay

## 2020-02-14 ENCOUNTER — Encounter: Payer: Self-pay | Admitting: Cardiology

## 2020-02-14 VITALS — BP 100/70 | HR 76 | Ht 65.0 in | Wt 148.0 lb

## 2020-02-14 DIAGNOSIS — R0602 Shortness of breath: Secondary | ICD-10-CM | POA: Diagnosis not present

## 2020-02-14 DIAGNOSIS — I480 Paroxysmal atrial fibrillation: Secondary | ICD-10-CM | POA: Diagnosis not present

## 2020-02-14 DIAGNOSIS — I4821 Permanent atrial fibrillation: Secondary | ICD-10-CM | POA: Diagnosis not present

## 2020-02-14 DIAGNOSIS — E785 Hyperlipidemia, unspecified: Secondary | ICD-10-CM

## 2020-02-14 NOTE — Patient Instructions (Addendum)
Medication Instructions:  Your physician recommends that you continue on your current medications as directed. Please refer to the Current Medication list given to you today.  *If you need a refill on your cardiac medications before your next appointment, please call your pharmacy*   Lab Work: Your physician recommends that you return for lab work in: TODAY CMP, Lipids, ProBNP If you have labs (blood work) drawn today and your tests are completely normal, you will receive your results only by: MyChart Message (if you have MyChart) OR A paper copy in the mail If you have any lab test that is abnormal or we need to change your treatment, we will call you to review the results.   Testing/Procedures: Your physician has requested that you have an echocardiogram. Echocardiography is a painless test that uses sound waves to create images of your heart. It provides your doctor with information about the size and shape of your heart and how well your heart's chambers and valves are working. This procedure takes approximately one hour. There are no restrictions for this procedure.    Follow-Up: At [facility], you and your health needs are our priority.  As part of our continuing mission to provide you with exceptional heart care, we have created designated Provider Care Teams.  These Care Teams include your primary Cardiologist (physician) and Advanced Practice Providers (APPs -  Physician Assistants and Nurse Practitioners) who all work together to provide you with the care you need, when you need it.  We recommend signing up for the patient portal called "MyChart".  Sign up information is provided on this After Visit Summary.  MyChart is used to connect with patients for Virtual Visits (Telemedicine).  Patients are able to view lab/test results, encounter notes, upcoming appointments, etc.  Non-urgent messages can be sent to your provider as well.   To learn more about what you can do with  MyChart, go to [email or web address].    Your next appointment:   6 month(s)  The format for your next appointment:   In Person  Provider:   Brian Munley, MD   Other Instructions   

## 2020-02-15 ENCOUNTER — Ambulatory Visit (INDEPENDENT_AMBULATORY_CARE_PROVIDER_SITE_OTHER): Payer: Medicare Other

## 2020-02-15 ENCOUNTER — Telehealth: Payer: Self-pay

## 2020-02-15 DIAGNOSIS — I4821 Permanent atrial fibrillation: Secondary | ICD-10-CM | POA: Diagnosis not present

## 2020-02-15 LAB — LIPID PANEL
Chol/HDL Ratio: 4.4 ratio (ref 0.0–4.4)
Cholesterol, Total: 236 mg/dL — ABNORMAL HIGH (ref 100–199)
HDL: 54 mg/dL (ref >39)
LDL Chol Calc (NIH): 163 mg/dL — ABNORMAL HIGH (ref 0–99)
Triglycerides: 105 mg/dL (ref 0–149)
VLDL Cholesterol Cal: 19 mg/dL (ref 5–40)

## 2020-02-15 LAB — COMPREHENSIVE METABOLIC PANEL
ALT: 28 IU/L (ref 0–32)
AST: 24 IU/L (ref 0–40)
Albumin/Globulin Ratio: 1.9 (ref 1.2–2.2)
Albumin: 4.6 g/dL (ref 3.7–4.7)
Alkaline Phosphatase: 68 IU/L (ref 44–121)
BUN/Creatinine Ratio: 15 (ref 12–28)
BUN: 16 mg/dL (ref 8–27)
Bilirubin Total: 0.7 mg/dL (ref 0.0–1.2)
CO2: 24 mmol/L (ref 20–29)
Calcium: 9.7 mg/dL (ref 8.7–10.3)
Chloride: 103 mmol/L (ref 96–106)
Creatinine, Ser: 1.1 mg/dL — ABNORMAL HIGH (ref 0.57–1.00)
GFR calc Af Amer: 58 mL/min/{1.73_m2} — ABNORMAL LOW (ref >59)
GFR calc non Af Amer: 50 mL/min/{1.73_m2} — ABNORMAL LOW (ref >59)
Globulin, Total: 2.4 g/dL (ref 1.5–4.5)
Glucose: 88 mg/dL (ref 65–99)
Potassium: 4.4 mmol/L (ref 3.5–5.2)
Sodium: 142 mmol/L (ref 134–144)
Total Protein: 7 g/dL (ref 6.0–8.5)

## 2020-02-15 LAB — ECHOCARDIOGRAM COMPLETE
P 1/2 time: 496 msec
S' Lateral: 2.3 cm

## 2020-02-15 LAB — PRO B NATRIURETIC PEPTIDE: NT-Pro BNP: 1627 pg/mL — ABNORMAL HIGH (ref 0–301)

## 2020-02-15 NOTE — Telephone Encounter (Signed)
Spoke with patient regarding results and recommendation.  Patient verbalizes understanding and is agreeable to plan of care. Advised patient to call back with any issues or concerns.  

## 2020-02-15 NOTE — Telephone Encounter (Signed)
Left message on patients voicemail to please return our call.   

## 2020-02-15 NOTE — Telephone Encounter (Signed)
Patient returning call.

## 2020-02-15 NOTE — Telephone Encounter (Signed)
-----   Message from Richardo Priest, MD sent at 02/15/2020  7:48 AM EST ----- Good/stable results no change in treatment

## 2020-02-15 NOTE — Progress Notes (Signed)
Complete echocardiogram performed.  Jimmy Yates Weisgerber RDCS, RVT  

## 2020-02-27 DIAGNOSIS — Z23 Encounter for immunization: Secondary | ICD-10-CM | POA: Diagnosis not present

## 2020-03-06 ENCOUNTER — Telehealth: Payer: Self-pay | Admitting: Cardiology

## 2020-03-06 NOTE — Telephone Encounter (Signed)
    Pt requesting to speak with Morga, she said she wanted to speak with her about her application for [employer] squibb

## 2020-03-07 NOTE — Telephone Encounter (Signed)
Follow up:     Patient returning a call from yesterday she has not heard from anyone.

## 2020-03-07 NOTE — Telephone Encounter (Signed)
I called the patient just now and she let me know that she called [employer] today and was told that they never received our fax with her paperwork for the patient assistance. I pulled out the forms from my December folder while on the phone with the patient and let her know that it was faxed in and went through successfully on 12/27/2019. I resent the fax at this time and the patient states that she will call them to make sure that they receive it.    Encouraged patient to call back with any questions or concerns.

## 2020-04-26 ENCOUNTER — Other Ambulatory Visit: Payer: Self-pay

## 2020-04-26 MED ORDER — METOPROLOL TARTRATE 50 MG PO TABS: ORAL_TABLET | ORAL | 1 refills | Status: DC

## 2020-04-26 NOTE — Telephone Encounter (Signed)
Metoprolol approved and sent 

## 2020-05-16 ENCOUNTER — Inpatient Hospital Stay: Payer: Medicare Other | Admitting: Oncology

## 2020-07-24 ENCOUNTER — Other Ambulatory Visit: Payer: Self-pay

## 2020-07-24 DIAGNOSIS — I482 Chronic atrial fibrillation, unspecified: Secondary | ICD-10-CM

## 2020-07-24 MED ORDER — ELIQUIS 5 MG PO TABS: 5.0000 mg | ORAL_TABLET | Freq: Two times a day (BID) | ORAL | 1 refills | Status: DC

## 2020-07-24 NOTE — Telephone Encounter (Signed)
Patient came into office requesting [place] call the pharmacy because she only enough Eliquis to last her until Monday but if we contact them today she can have them by Friday.   Patient stated that [facility]os[facility] Harps had tried contacting us several times for a prescription for the medication.  There is no records of any phone calls in the chart.

## 2020-07-24 NOTE — Telephone Encounter (Signed)
Prescription refill request for Eliquis received. Indication: a fib Last office visit: 02/14/20 Scr: 1.1 Age: 75 Weight: 67kg

## 2020-08-01 ENCOUNTER — Other Ambulatory Visit: Payer: Self-pay | Admitting: Cardiology

## 2020-08-15 NOTE — Progress Notes (Signed)
[place]  [address] [place],  [place]  [address] [phone]  Clinic Day:  08/21/2020  Referring physician: Mateo Flow, MD  This document serves as a record of services personally performed by Hosie Poisson, MD. It was created on their behalf by [facility] E, a trained medical scribe. The creation of this record is based on the scribe's personal observations and the provider's statements to them.  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  This was found through a screening mammogram.   She was treated with lumpectomy.  Pathology revealed 44 mm, grade 2, ductal carcinoma in situ with apocrine features involving a complex sclerosing lesion with multiple foci of sclerosing adenosis.  No invasive cancer was seen. She had a history of 2 prior benign breast biopsies.  She received adjuvant radiation completed in January.  Bone density scan in December revealed osteopenia with a T-score of the spine of -1.3 which is improved by 2.7%, and the T-score of the femur at -2.0 which represents a 13.3% worsening.  She has multiple other medical comorbidities including atrial fibrillation, heart failure and history of gastric culture.  She  declined chemoprevention.  She underwent testing for hereditary cancer syndromes with the [employer] Hereditary Cancer panel.  This did not reveal any clinically significant mutation, but she did have 4 variants of uncertain significance in the APC, DICER1, TERT, and TSC1 genes.  Bilateral diagnostic mammogram in September 2020 did not reveal any evidence of malignancy.  She has CHF and sees Dr. Bettina Gavia.  She states her disease has been  well controlled with Entresto.    INTERVAL HISTORY:  Gloria Harrington is here for routine follow up.  Annual mammogram from October 2021 was clear and Dr. Lilia Pro has scheduled her next mammogram for this October.  Se did have removal of a lesion of her mid abdomen and this was benign.  She is scheduled for her yearly physical next week and expects that labs will be done.  She saw her cardiologist last week.  Her  appetite is good, and she is eating well.  She denies fever, chills or other signs of infection.  She denies nausea, vomiting, bowel issues, or abdominal pain.  She denies sore throat, cough, dyspnea, or chest pain.  REVIEW OF SYSTEMS:  Review of Systems  Constitutional: Negative.  Negative for appetite change, chills, fatigue, fever and unexpected weight change.  HENT:  Negative.    Eyes: Negative.   Respiratory: Negative.  Negative for chest tightness, cough, hemoptysis, shortness of breath and wheezing.   Cardiovascular: Negative.  Negative for chest pain, leg swelling and palpitations.  Gastrointestinal: Negative.  Negative for abdominal distention, abdominal pain, blood in stool, constipation, diarrhea, nausea and vomiting.  Endocrine: Negative.   Genitourinary: Negative.  Negative for difficulty urinating, dysuria, frequency and hematuria.   Musculoskeletal: Negative.  Negative for arthralgias, back pain, flank pain, gait problem and myalgias.  Skin: Negative.   Neurological: Negative.  Negative for dizziness, extremity weakness, gait problem, headaches, light-headedness, numbness, seizures and speech difficulty.  Hematological: Negative.   Psychiatric/Behavioral: Negative.  Negative for depression and sleep disturbance. The patient is not nervous/anxious.     VITALS:  Blood pressure 137/69, pulse 74, temperature 98.3 F (36.8 C), temperature source Oral, resp. rate 18, height '5\' 5"'$  (1.651 m), weight 143 lb 9.6 oz (65.1 kg), SpO2 98 %.  Wt Readings from Last 3 Encounters:  08/21/20 143  lb 9.6 oz (65.1 kg)  08/16/20 144 lb (65.3 kg)  02/14/20 148 lb (67.1 kg)    Body mass index is 23.9 kg/m.  Performance status (ECOG): 0 - Asymptomatic  PHYSICAL EXAM:  Physical Exam Constitutional:      General: She is not in acute distress.    Appearance: Normal appearance. She is normal weight.  HENT:     Head: Normocephalic and atraumatic.  Eyes:     General: No scleral icterus.    Extraocular Movements: Extraocular movements intact.     Conjunctiva/sclera: Conjunctivae normal.     Pupils: Pupils are equal, round, and reactive to light.  Cardiovascular:     Rate and Rhythm: Normal rate and regular rhythm.     Pulses: Normal pulses.     Heart sounds: Normal heart sounds. No murmur heard.   No friction rub. No gallop.  Pulmonary:     Effort: Pulmonary effort is normal. No respiratory distress.     Breath sounds: Normal breath sounds.  Chest:  Breasts:    Right: Normal.     Left: Normal.     Comments: Breasts are without masses. Abdominal:     General: Bowel sounds are normal. There is no distension.     Palpations: Abdomen is soft. There is no hepatomegaly, splenomegaly or mass.     Tenderness: There is no abdominal tenderness.     Comments: Scar of the upper mid abdomen where her lesion was removed.  Musculoskeletal:        General: Normal range of motion.     Cervical back: Normal range of motion and neck supple.     Right lower leg: No edema.     Left lower leg: No edema.  Lymphadenopathy:     Cervical: No cervical adenopathy.  Skin:    General: Skin is warm and dry.  Neurological:     General: No focal deficit present.     Mental Status: She is alert and oriented to person, place, and time. Mental status is at baseline.  Psychiatric:        Mood and Affect: Mood normal.        Behavior: Behavior normal.        Thought Content: Thought content normal.        Judgment: Judgment normal.    LABS:  No flowsheet data found. CMP Latest Ref Rng & Units  02/14/2020 02/15/2019 12/29/2017  Glucose 65 - 99 mg/dL 88 89 99  BUN 8 - 27 mg/dL '16 19 23  '$ Creatinine 0.57 - 1.00 mg/dL 1.10(H) 1.07(H) 1.15(H)  Sodium 134 - 144 mmol/L 142 143 139  Potassium 3.5 - 5.2 mmol/L 4.4 4.5 4.4  Chloride 96 - 106 mmol/L 103 104 102  CO2 20 - 29 mmol/L '24 22 20  '$ Calcium 8.7 - 10.3 mg/dL 9.7 10.0 9.8  Total Protein 6.0 - 8.5 g/dL 7.0 7.2 -  Total Bilirubin 0.0 - 1.2 mg/dL 0.7 0.9 -  Alkaline Phos 44 - 121 IU/L 68 58 -  AST 0 - 40 IU/L 24 18 -  ALT 0 - 32 IU/L 28 22 -    STUDIES:  No results found.   EXAM: 11/02/2020 DIGITAL DIAGNOSTIC BILATERAL MAMMOGRAM WITH TOMO AND CAD  COMPARISON:  Previous exam(s).  ACR Breast Density Category c: The breast tissue is heterogeneously dense, which may obscure small masses.  FINDINGS: Stable postlumpectomy changes left breast. No new masses, calcifications or nonsurgical distortion identified within either breast.  Mammographic images were processed with CAD.  IMPRESSION: No mammographic evidence for malignancy. Stable lumpectomy changes left breast.   Allergies:  Allergies  Allergen Reactions   Sulfamethoxazole-Trimethoprim Swelling    Tongue swelling   Clindamycin Other (See Comments)    unknown unknown   Nitrofurantoin Other (See Comments)    unknown unknown   Penicillins Itching    On feet   Prednisone Other (See Comments)    Fever and shaking   Latex Rash    Current Medications: Current Outpatient Medications  Medication Sig Dispense Refill   calcium-vitamin D (OSCAL WITH D) 500-200 MG-UNIT tablet Take 1 tablet by mouth 2 (two) times daily.     dicyclomine (BENTYL) 10 MG capsule Take 10 mg by mouth 2 (two) times daily as needed for pain.     ELIQUIS 5 MG TABS tablet Take 1 tablet (5 mg total) by mouth 2 (two) times daily. 180 tablet 1   furosemide (LASIX) 20 MG tablet TAKE 1 TABLET BY MOUTH DAILY. TAKE AN EXTRA TABLET IF YOU WEIGHT 138 OR GREATER 180 tablet 2   levothyroxine (SYNTHROID,  LEVOTHROID) 75 MCG tablet Take 75 mcg by mouth daily.  0   metoprolol tartrate (LOPRESSOR) 50 MG tablet TAKE 1 TABLET(50 MG) BY MOUTH TWICE DAILY 180 tablet 1   Multiple Vitamin (MULTI-VITAMINS) TABS Take 1 tablet by mouth daily.     Omega-3 Fatty Acids (FISH OIL) 1000 MG CAPS Take 2 capsules by mouth 2 (two) times daily.     pantoprazole (PROTONIX) 40 MG tablet Take 40 mg by mouth daily.  12   pravastatin (PRAVACHOL) 40 MG tablet TAKE 1 TABLET(40 MG) BY MOUTH DAILY 90 tablet 1   sacubitril-valsartan (ENTRESTO) 49-51 MG Take 1 tablet by mouth 2 (two) times daily. 180 tablet 0   No current facility-administered medications for this visit.     ASSESSMENT & PLAN:   Assessment:  Gloria Harrington is a 75 y.o. female with ductal carcinoma in situ of the left breast.  She remains without evidence of disease.    Plan: She sees Dr. Lilia Pro in October with bilateral mammogram.  We will plan to see her back in 1 year for re-examination.  The patient understands the plans discussed today and is in agreement with them.  She knows to contact our office if she develops concerns regarding her breast cancer.   I provided 15 minutes of face-to-face time during this this encounter and > 50% was spent counseling as documented under my assessment and plan.    Derwood Kaplan, MD [facility] AT [facility] [address] [place] [address] Dept: [phone] Dept Fax: [phone]   I, Rita Ohara, am acting as scribe for Derwood Kaplan, MD  I have reviewed this report as typed by the medical scribe, and it is complete and accurate.

## 2020-08-15 NOTE — Progress Notes (Signed)
Cardiology Office Note:    Date:  08/16/2020   ID:  Gloria Harrington, DOB [id number], MRN [id number]  PCP:  Mateo Flow, MD  Cardiologist:  Shirlee More, MD    Referring MD: Mateo Flow, MD    ASSESSMENT:    1. Permanent atrial fibrillation ([place])   2. Chronic anticoagulation   3. Hypertensive heart disease with chronic systolic congestive heart failure ([place])   4. Hyperlipidemia, unspecified hyperlipidemia type    PLAN:    In order of problems listed above:  Stable asymptomatic continue her beta-blocker effective rate control on her long-term anticoagulation with Eliquis. Stable BP at target no fluid overload or takes a very small low dose of loop diuretic and has done quite well Continue her Entresto. Continue low intensity statin   Next appointment: 6 months   Medication Adjustments/Labs and Tests Ordered: Current medicines are reviewed at length with the patient today.  Concerns regarding medicines are outlined above.  Orders Placed This Encounter  Procedures   EKG 12-Lead   No orders of the defined types were placed in this encounter.   Chief Complaint  Patient presents with   Follow-up   Congestive Heart Failure    History of Present Illness:    Gloria Harrington is a 75 y.o. female with a hx of longstanding permanent atrial fibrillation anticoagulated hyperlipidemia hypertension and cardiomyopathy most recent ejection fraction January 2020 l 55 to 60% ast seen 02/14/2020.  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 She went away in March came back and had a respiratory infection both her husband and her granddaughter in retrospect I think she had COVID-19 infection she was fully vaccinated  first. Encouraged her to get a second booster next week She tolerates her low intensity statin without muscle pain or weakness Her last labs showed significant residual elevation of her LDL cholesterol declined PCSK9 inhibitors.  Echocardiogram 02/14/2018 showed preserved EF 55 to 60% normal right ventricular size function and pulmonary artery pressure mild mitral and tricuspid regurgitation and the atria were normal in size. 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   Chronic anticoagulation 08/14/2014   Coronary artery calcification seen on CT scan 08/14/2014   Essential hypertension 08/14/2014   Hyperkalemia 03/24/2016   Hyperlipidemia 08/14/2014   Hypothyroid 09/17/2016   PAF (paroxysmal atrial fibrillation) ([place]) 08/14/2014   Overview:  CHADS2 vasc score=4   Palpitation 09/17/2016    Past Surgical History:  Procedure Laterality Date   ARTHROPLASTY     for hammertoe   BREAST BIOPSY     HALLUX VALGUS CORRECTION     open tenotomy of extensor second toe     TUBAL LIGATION      Current Medications: Current Meds  Medication Sig   calcium-vitamin D (OSCAL WITH D) 500-200 MG-UNIT tablet Take 1 tablet by mouth 2 (two) times daily.   dicyclomine (BENTYL) 10 MG capsule Take 10 mg by mouth 2 (two) times daily as needed for pain.   ELIQUIS 5 MG TABS tablet Take 1 tablet (5 mg total) by mouth 2 (two) times daily.   furosemide (LASIX) 20 MG tablet TAKE 1 TABLET BY MOUTH DAILY.  TAKE AN EXTRA TABLET IF YOU WEIGHT 138 OR GREATER   levothyroxine (SYNTHROID, LEVOTHROID) 75 MCG tablet Take 75 mcg by mouth daily.   metoprolol tartrate (LOPRESSOR) 50 MG tablet TAKE 1 TABLET(50 MG) BY MOUTH TWICE DAILY   Multiple Vitamin (MULTI-VITAMINS) TABS Take 1 tablet by mouth daily.   Omega-3 Fatty Acids (FISH OIL) 1000 MG CAPS Take 2 capsules by mouth 2 (two) times daily.   pantoprazole (PROTONIX) 40 MG tablet Take 40 mg by mouth daily.   pravastatin  (PRAVACHOL) 40 MG tablet TAKE 1 TABLET(40 MG) BY MOUTH DAILY   sacubitril-valsartan (ENTRESTO) 49-51 MG Take 1 tablet by mouth 2 (two) times daily.     Allergies:   Sulfamethoxazole-trimethoprim, Clindamycin, Nitrofurantoin, Penicillins, Prednisone, and Latex   Social History   Socioeconomic History   Marital status: Married    Spouse name: Not on file   Number of children: Not on file   Years of education: Not on file   Highest education level: Not on file  Occupational History   Not on file  Tobacco Use   Smoking status: Never   Smokeless tobacco: Never  Vaping Use   Vaping Use: Never used  Substance and Sexual Activity   Alcohol use: No   Drug use: No   Sexual activity: Not on file  Other Topics Concern   Not on file  Social History Narrative   Not on file   Social Determinants of Health   Financial Resource Strain: Not on file  Food Insecurity: Not on file  Transportation Needs: Not on file  Physical Activity: Not on file  Stress: Not on file  Social Connections: Not on file     Family History: The patient's family history includes Breast cancer in her sister and sister; Hyperlipidemia in her sister and sister. ROS:   Please see the history of present illness.    All other systems reviewed and are negative.  EKGs/Labs/Other Studies Reviewed:    The following studies were reviewed today:  EKG:  EKG ordered today and personally reviewed.  The ekg ordered today demonstrates atrial fibrillation controlled ventricular rate 110 bpm  Recent Labs: 02/14/2020: ALT 28; BUN 16; Creatinine, Ser 1.10; NT-Pro BNP 1,627; Potassium 4.4; Sodium 142  Recent Lipid Panel    Component Value Date/Time   CHOL 236 (H) 02/14/2020 0929   TRIG 105 02/14/2020 0929   HDL 54 02/14/2020 0929   CHOLHDL 4.4 02/14/2020 0929   LDLCALC 163 (H) 02/14/2020 0929    Physical Exam:    VS:  BP 122/82   Pulse (!) 115   Ht '5\' 5"'$  (1.651 m)   Wt 144 lb (65.3 kg)   LMP  (LMP Unknown)   SpO2  98%   BMI 23.96 kg/m     Wt Readings from Last 3 Encounters:  08/16/20 144 lb (65.3 kg)  02/14/20 148 lb (67.1 kg)  08/18/19 143 lb (64.9 kg)     GEN:  Well nourished, well developed in no acute distress HEENT: Normal NECK: No JVD; No carotid bruits LYMPHATICS: No lymphadenopathy CARDIAC: Irregular rate and rhythm no murmurs, rubs, gallops RESPIRATORY:  Clear to auscultation without rales, wheezing or rhonchi  ABDOMEN: Soft, non-tender, non-distended MUSCULOSKELETAL:  No edema; No deformity  SKIN: Warm and dry NEUROLOGIC:  Alert and oriented x 3 PSYCHIATRIC:  Normal affect    Signed, Shirlee More, MD  08/16/2020 10:40 AM    [place]

## 2020-08-16 ENCOUNTER — Encounter: Payer: Self-pay | Admitting: Cardiology

## 2020-08-16 ENCOUNTER — Other Ambulatory Visit: Payer: Self-pay

## 2020-08-16 ENCOUNTER — Ambulatory Visit: Payer: Medicare Other | Admitting: Cardiology

## 2020-08-16 VITALS — BP 122/82 | HR 115 | Ht 65.0 in | Wt 144.0 lb

## 2020-08-16 DIAGNOSIS — I110 Hypertensive heart disease with heart failure: Secondary | ICD-10-CM

## 2020-08-16 DIAGNOSIS — I5022 Chronic systolic (congestive) heart failure: Secondary | ICD-10-CM | POA: Diagnosis not present

## 2020-08-16 DIAGNOSIS — I4821 Permanent atrial fibrillation: Secondary | ICD-10-CM | POA: Diagnosis not present

## 2020-08-16 DIAGNOSIS — Z7901 Long term (current) use of anticoagulants: Secondary | ICD-10-CM

## 2020-08-16 DIAGNOSIS — E785 Hyperlipidemia, unspecified: Secondary | ICD-10-CM

## 2020-08-16 NOTE — Patient Instructions (Signed)

## 2020-08-21 ENCOUNTER — Telehealth: Payer: Self-pay | Admitting: Cardiology

## 2020-08-21 ENCOUNTER — Other Ambulatory Visit: Payer: Self-pay

## 2020-08-21 ENCOUNTER — Inpatient Hospital Stay: Payer: Medicare Other | Attending: Oncology | Admitting: Oncology

## 2020-08-21 ENCOUNTER — Telehealth: Payer: Self-pay | Admitting: Oncology

## 2020-08-21 ENCOUNTER — Encounter: Payer: Self-pay | Admitting: Oncology

## 2020-08-21 VITALS — BP 137/69 | HR 74 | Temp 98.3°F | Resp 18 | Ht 65.0 in | Wt 143.6 lb

## 2020-08-21 DIAGNOSIS — D0512 Intraductal carcinoma in situ of left breast: Secondary | ICD-10-CM

## 2020-08-21 DIAGNOSIS — M8580 Other specified disorders of bone density and structure, unspecified site: Secondary | ICD-10-CM

## 2020-08-21 DIAGNOSIS — Z780 Asymptomatic menopausal state: Secondary | ICD-10-CM | POA: Diagnosis not present

## 2020-08-21 NOTE — Telephone Encounter (Signed)
Spoke to the patient just now and she was wanting to know what specific cardiac labs Dr. Bettina Gavia wanted done when she saw her PCP. I let her know that the only one specific to our office and not a routine lab would be the ProBNP. She is going to write it down and will ask them to draw it next Monday when she sees her PCP.    Encouraged patient to call back with any questions or concerns.

## 2020-08-21 NOTE — Telephone Encounter (Signed)
pt has a question in regards to her last office visit... requesting to speak w/ nurse Lilia Pro.. please advise

## 2020-08-21 NOTE — Telephone Encounter (Signed)
Per 8/9 LOS, patient scheduled for Aug 2023 Appt.  Gave patient Appt Summary

## 2020-08-24 DIAGNOSIS — M8580 Other specified disorders of bone density and structure, unspecified site: Secondary | ICD-10-CM | POA: Insufficient documentation

## 2020-08-24 DIAGNOSIS — Z780 Asymptomatic menopausal state: Secondary | ICD-10-CM

## 2020-08-24 HISTORY — DX: Other specified disorders of bone density and structure, unspecified site: M85.80

## 2020-08-24 HISTORY — DX: Asymptomatic menopausal state: Z78.0

## 2020-08-27 DIAGNOSIS — E790 Hyperuricemia without signs of inflammatory arthritis and tophaceous disease: Secondary | ICD-10-CM | POA: Diagnosis not present

## 2020-08-27 DIAGNOSIS — Z79899 Other long term (current) drug therapy: Secondary | ICD-10-CM | POA: Diagnosis not present

## 2020-08-27 DIAGNOSIS — I4891 Unspecified atrial fibrillation: Secondary | ICD-10-CM | POA: Diagnosis not present

## 2020-08-27 DIAGNOSIS — M1310 Monoarthritis, not elsewhere classified, unspecified site: Secondary | ICD-10-CM | POA: Diagnosis not present

## 2020-08-27 DIAGNOSIS — E039 Hypothyroidism, unspecified: Secondary | ICD-10-CM | POA: Diagnosis not present

## 2020-08-27 DIAGNOSIS — K279 Peptic ulcer, site unspecified, unspecified as acute or chronic, without hemorrhage or perforation: Secondary | ICD-10-CM | POA: Diagnosis not present

## 2020-08-27 DIAGNOSIS — Z0000 Encounter for general adult medical examination without abnormal findings: Secondary | ICD-10-CM | POA: Diagnosis not present

## 2020-08-27 DIAGNOSIS — I10 Essential (primary) hypertension: Secondary | ICD-10-CM | POA: Diagnosis not present

## 2020-08-27 DIAGNOSIS — I509 Heart failure, unspecified: Secondary | ICD-10-CM | POA: Diagnosis not present

## 2020-08-27 DIAGNOSIS — E782 Mixed hyperlipidemia: Secondary | ICD-10-CM | POA: Diagnosis not present

## 2020-09-12 DIAGNOSIS — H40013 Open angle with borderline findings, low risk, bilateral: Secondary | ICD-10-CM | POA: Diagnosis not present

## 2020-09-12 DIAGNOSIS — H25813 Combined forms of age-related cataract, bilateral: Secondary | ICD-10-CM | POA: Diagnosis not present

## 2020-09-12 DIAGNOSIS — E782 Mixed hyperlipidemia: Secondary | ICD-10-CM | POA: Diagnosis not present

## 2020-09-12 LAB — HEPATIC FUNCTION PANEL
ALT: 20 (ref 7–35)
AST: 17 (ref 13–35)
Alkaline Phosphatase: 51 (ref 25–125)
Bilirubin, Total: 1

## 2020-09-12 LAB — HEMOGLOBIN A1C: Hemoglobin A1C: 0.653

## 2020-09-12 LAB — CBC AND DIFFERENTIAL
HCT: 37 (ref 36–46)
Hemoglobin: 13 (ref 12.0–16.0)
Platelets: 185 (ref 150–399)
WBC: 4.6

## 2020-09-12 LAB — BASIC METABOLIC PANEL
BUN: 24 — AB (ref 4–21)
CO2: 27 — AB (ref 13–22)
Chloride: 105 (ref 99–108)
Creatinine: 1 (ref 0.5–1.1)
Glucose: 104
Potassium: 4.2 (ref 3.4–5.3)
Sodium: 142 (ref 137–147)

## 2020-09-12 LAB — LIPID PANEL
Cholesterol: 229 — AB (ref 0–200)
HDL: 51 (ref 35–70)
LDL Cholesterol: 155
LDl/HDL Ratio: 3
Triglycerides: 115 (ref 40–160)

## 2020-09-12 LAB — CBC: RBC: 4.17 (ref 3.87–5.11)

## 2020-09-12 LAB — COMPREHENSIVE METABOLIC PANEL
Albumin: 4.6 (ref 3.5–5.0)
Calcium: 9.8 (ref 8.7–10.7)

## 2020-10-08 ENCOUNTER — Encounter: Payer: Self-pay | Admitting: Hematology and Oncology

## 2020-10-08 LAB — URIC ACID: Uric Acid: 8.6

## 2020-10-21 ENCOUNTER — Other Ambulatory Visit: Payer: Self-pay | Admitting: Cardiology

## 2020-10-29 DIAGNOSIS — Z23 Encounter for immunization: Secondary | ICD-10-CM | POA: Diagnosis not present

## 2020-11-01 DIAGNOSIS — H40013 Open angle with borderline findings, low risk, bilateral: Secondary | ICD-10-CM | POA: Diagnosis not present

## 2020-11-05 DIAGNOSIS — R922 Inconclusive mammogram: Secondary | ICD-10-CM | POA: Diagnosis not present

## 2020-11-05 DIAGNOSIS — Z853 Personal history of malignant neoplasm of breast: Secondary | ICD-10-CM | POA: Diagnosis not present

## 2020-11-09 DIAGNOSIS — Z853 Personal history of malignant neoplasm of breast: Secondary | ICD-10-CM | POA: Diagnosis not present

## 2020-11-19 DIAGNOSIS — Z23 Encounter for immunization: Secondary | ICD-10-CM | POA: Diagnosis not present

## 2020-12-21 ENCOUNTER — Other Ambulatory Visit: Payer: Self-pay

## 2020-12-21 ENCOUNTER — Telehealth: Payer: Self-pay | Admitting: Cardiology

## 2020-12-21 MED ORDER — ENTRESTO 49-51 MG PO TABS: 1.0000 | ORAL_TABLET | Freq: Two times a day (BID) | ORAL | 3 refills | Status: DC

## 2020-12-21 NOTE — Telephone Encounter (Signed)
   Pt c/o medication issue:  1. Name of Medication:   sacubitril-valsartan (ENTRESTO) 49-51 MG    2. How are you currently taking this medication (dosage and times per day)?  Take 1 tablet by mouth 2 (two) times daily.      3. Are you having a reaction (difficulty breathing--STAT)?   4. What is your medication issue? Pt is calling, she would like to speak with Lilia Pro about re-enrolling her entresto to [employer]vartis

## 2020-12-21 NOTE — Telephone Encounter (Signed)
Spoke to the patient just now and she let me know that she needed [place] to send in her Novartis papers again as they told her that the prescription was not received.   I have faxed these again for her at this time.

## 2020-12-25 ENCOUNTER — Other Ambulatory Visit: Payer: Self-pay | Admitting: Cardiology

## 2020-12-25 DIAGNOSIS — I5042 Chronic combined systolic (congestive) and diastolic (congestive) heart failure: Secondary | ICD-10-CM

## 2021-01-02 ENCOUNTER — Telehealth: Payer: Self-pay

## 2021-01-02 DIAGNOSIS — L821 Other seborrheic keratosis: Secondary | ICD-10-CM | POA: Diagnosis not present

## 2021-01-02 DIAGNOSIS — L720 Epidermal cyst: Secondary | ICD-10-CM | POA: Diagnosis not present

## 2021-01-02 DIAGNOSIS — L820 Inflamed seborrheic keratosis: Secondary | ICD-10-CM | POA: Diagnosis not present

## 2021-01-02 DIAGNOSIS — I482 Chronic atrial fibrillation, unspecified: Secondary | ICD-10-CM

## 2021-01-02 MED ORDER — ELIQUIS 5 MG PO TABS: 5.0000 mg | ORAL_TABLET | Freq: Two times a day (BID) | ORAL | 1 refills | Status: DC

## 2021-01-02 NOTE — Addendum Note (Signed)
Addended by: Rollen Sox on: 01/02/2021 07:51 AM   Modules accepted: Orders

## 2021-01-02 NOTE — Telephone Encounter (Signed)
Prescription refill request for Eliquis received. Indication: a fib Last office visit: 08/16/20 Scr: 1.0 Age: 75 Weight: 65kg

## 2021-01-02 NOTE — Telephone Encounter (Signed)
[facility] faxed a request for patients Eliquis.

## 2021-02-01 ENCOUNTER — Other Ambulatory Visit: Payer: Self-pay | Admitting: Cardiology

## 2021-02-18 NOTE — Progress Notes (Signed)
Cardiology Office Note:    Date:  02/19/2021   ID:  Gloria Harrington, DOB [birth year], MRN [id number]  PCP:  Mateo Flow, MD  Cardiologist:  Shirlee More, MD    Referring MD: Mateo Flow, MD    ASSESSMENT:    1. Chronic atrial fibrillation ([place])   2. Chronic anticoagulation   3. Hypertensive heart disease with chronic systolic congestive heart failure ([place])   4. Hyperlipidemia, unspecified hyperlipidemia type   5. Nonrheumatic mitral valve regurgitation    PLAN:    In order of problems listed above:  Gloria Harrington continues to do well he is rate controlled atrial fibrillation with her beta-blocker and is anticoagulated. Stable hypertension BP at target and her cardiomyopathy improved EF is normalized we will continue beta-blocker and maximally tolerated Entresto Stable hyperlipidemia recheck liver function and renal function potassium lipids Consider repeat echocardiogram next visit   Next appointment: 6 months   Medication Adjustments/Labs and Tests Ordered: Current medicines are reviewed at length with the patient today.  Concerns regarding medicines are outlined above.  No orders of the defined types were placed in this encounter.  No orders of the defined types were placed in this encounter.   Chief Complaint  Patient presents with   Follow-up   Atrial Fibrillation   Congestive Heart Failure    History of Present Illness:    Gloria Harrington is a 76 y.o. female with a hx of  longstanding permanent atrial fibrillation anticoagulated hyperlipidemia hypertension and cardiomyopathy most recent ejection fraction January 2020 l 55 to 60%  last seen 08/16/2020. Compliance with diet, lifestyle and medications: Yes    She takes very good care of her.  She is meticulous exercising avoid traditional weight stable blood pressure.   She has had a good quality of life and is not having any bleeding complications from Eliquis and even though she is  poorly statin intolerant she does tolerate Pravachol.  No edema shortness of breath chest pain palpitation or syncope   Past Medical History:  Diagnosis Date   Breast cancer ([place]) 10/28/2016   Ductal carcinoma of left breast   Chronic anticoagulation 08/14/2014   Coronary artery calcification seen on CT scan 08/14/2014   Essential hypertension 08/14/2014   Hyperkalemia 03/24/2016   Hyperlipidemia 08/14/2014   Hypothyroid 09/17/2016   PAF (paroxysmal atrial fibrillation) ([place]) 08/14/2014   Overview:  CHADS2 vasc score=4   Palpitation 09/17/2016    Past Surgical History:  Procedure Laterality Date   ARTHROPLASTY     for hammertoe   BREAST BIOPSY     HALLUX VALGUS CORRECTION     open tenotomy of extensor second toe     TUBAL LIGATION      Current Medications: Current Meds  Medication Sig   calcium-vitamin D (OSCAL WITH D) 500-200 MG-UNIT tablet Take 1 tablet by mouth 2 (two) times daily.   dicyclomine (BENTYL) 10 MG capsule Take 10 mg by mouth 2 (two) times daily as needed for pain.   ELIQUIS 5 MG TABS tablet Take 1 tablet (5 mg total) by mouth 2 (two) times daily.   furosemide (LASIX) 20 MG tablet TAKE 1 TABLET BY MOUTH DAILY. TAKE AN EXTRA TABLET IF YOU WEIGH 138 OR GREATER   levothyroxine (SYNTHROID, LEVOTHROID) 75 MCG tablet Take 75 mcg by mouth daily.   metoprolol tartrate (LOPRESSOR) 50 MG tablet TAKE 1 TABLET(50 MG) BY MOUTH TWICE DAILY   Multiple Vitamin (MULTI-VITAMINS) TABS Take 1 tablet by mouth daily.  Omega-3 Fatty Acids (FISH OIL) 1000 MG CAPS Take 2 capsules by mouth 2 (two) times daily.   pantoprazole (PROTONIX) 40 MG tablet Take 40 mg by mouth daily.   pravastatin (PRAVACHOL) 40 MG tablet TAKE 1 TABLET(40 MG) BY MOUTH DAILY   sacubitril-valsartan (ENTRESTO) 49-51 MG Take 1 tablet by mouth 2 (two) times daily.     Allergies:   Sulfamethoxazole-trimethoprim, Clindamycin, Nitrofurantoin, Penicillins, Prednisone, and Latex   Social History   Socioeconomic History    Marital status: Married    Spouse name: Not on file   Number of children: Not on file   Years of education: Not on file   Highest education level: Not on file  Occupational History   Not on file  Tobacco Use   Smoking status: Never    Passive exposure: Never   Smokeless tobacco: Never  Vaping Use   Vaping Use: Never used  Substance and Sexual Activity   Alcohol use: No   Drug use: No   Sexual activity: Not on file  Other Topics Concern   Not on file  Social History Narrative   Not on file   Social Determinants of Health   Financial Resource Strain: Not on file  Food Insecurity: Not on file  Transportation Needs: Not on file  Physical Activity: Not on file  Stress: Not on file  Social Connections: Not on file     Family History: The patient's family history includes Breast cancer in her sister and sister; Hyperlipidemia in her sister and sister. ROS:   Please see the history of present illness.    All other systems reviewed and are negative.  EKGs/Labs/Other Studies Reviewed:    The following studies were reviewed today:   Recent Labs: 09/12/2020: ALT 20; BUN 24; Creatinine 1.0; Hemoglobin 13.0; Platelets 185; Potassium 4.2; Sodium 142  Recent Lipid Panel    Component Value Date/Time   CHOL 229 (A) 09/12/2020 0000   CHOL 236 (H) 02/14/2020 0929   TRIG 115 09/12/2020 0000   HDL 51 09/12/2020 0000   HDL 54 02/14/2020 0929   CHOLHDL 4.4 02/14/2020 0929   LDLCALC 155 09/12/2020 0000   LDLCALC 163 (H) 02/14/2020 0929    Physical Exam:    VS:  BP 108/62 (BP Location: Left Arm)    Pulse 74    Ht 5\' 5"  (1.651 m)    Wt 145 lb (65.8 kg)    LMP  (LMP Unknown)    SpO2 98%    BMI 24.13 kg/m     Wt Readings from Last 3 Encounters:  02/19/21 145 lb (65.8 kg)  08/21/20 143 lb 9.6 oz (65.1 kg)  08/16/20 144 lb (65.3 kg)     GEN:  Well nourished, well developed in no acute distress HEENT: Normal NECK: No JVD; No carotid bruits LYMPHATICS: No  lymphadenopathy CARDIAC: Irregular rhythm I cannot auscultate aortic regurgitation RESPIRATORY:  Clear to auscultation without rales, wheezing or rhonchi  ABDOMEN: Soft, non-tender, non-distended MUSCULOSKELETAL:  No edema; No deformity  SKIN: Warm and dry NEUROLOGIC:  Alert and oriented x 3 PSYCHIATRIC:  Normal affect    Signed, Shirlee More, MD  02/19/2021 10:39 AM    [place]

## 2021-02-19 ENCOUNTER — Ambulatory Visit: Payer: Medicare Other | Admitting: Cardiology

## 2021-02-19 ENCOUNTER — Other Ambulatory Visit: Payer: Self-pay

## 2021-02-19 ENCOUNTER — Encounter: Payer: Self-pay | Admitting: Cardiology

## 2021-02-19 VITALS — BP 108/62 | HR 74 | Ht 65.0 in | Wt 145.0 lb

## 2021-02-19 DIAGNOSIS — E785 Hyperlipidemia, unspecified: Secondary | ICD-10-CM

## 2021-02-19 DIAGNOSIS — I5022 Chronic systolic (congestive) heart failure: Secondary | ICD-10-CM

## 2021-02-19 DIAGNOSIS — I340 Nonrheumatic mitral (valve) insufficiency: Secondary | ICD-10-CM

## 2021-02-19 DIAGNOSIS — Z7901 Long term (current) use of anticoagulants: Secondary | ICD-10-CM | POA: Diagnosis not present

## 2021-02-19 DIAGNOSIS — I110 Hypertensive heart disease with heart failure: Secondary | ICD-10-CM

## 2021-02-19 DIAGNOSIS — I482 Chronic atrial fibrillation, unspecified: Secondary | ICD-10-CM | POA: Diagnosis not present

## 2021-02-19 LAB — COMPREHENSIVE METABOLIC PANEL
ALT: 22 IU/L (ref 0–32)
AST: 20 IU/L (ref 0–40)
Albumin/Globulin Ratio: 2 (ref 1.2–2.2)
Albumin: 4.7 g/dL (ref 3.7–4.7)
Alkaline Phosphatase: 63 IU/L (ref 44–121)
BUN/Creatinine Ratio: 17 (ref 12–28)
BUN: 18 mg/dL (ref 8–27)
Bilirubin Total: 0.8 mg/dL (ref 0.0–1.2)
CO2: 25 mmol/L (ref 20–29)
Calcium: 9.9 mg/dL (ref 8.7–10.3)
Chloride: 104 mmol/L (ref 96–106)
Creatinine, Ser: 1.08 mg/dL — ABNORMAL HIGH (ref 0.57–1.00)
Globulin, Total: 2.4 g/dL (ref 1.5–4.5)
Glucose: 107 mg/dL — ABNORMAL HIGH (ref 70–99)
Potassium: 4.4 mmol/L (ref 3.5–5.2)
Sodium: 141 mmol/L (ref 134–144)
Total Protein: 7.1 g/dL (ref 6.0–8.5)
eGFR: 54 mL/min/{1.73_m2} — ABNORMAL LOW (ref >59)

## 2021-02-19 LAB — LIPID PANEL
Chol/HDL Ratio: 4.1 ratio (ref 0.0–4.4)
Cholesterol, Total: 224 mg/dL — ABNORMAL HIGH (ref 100–199)
HDL: 55 mg/dL (ref >39)
LDL Chol Calc (NIH): 138 mg/dL — ABNORMAL HIGH (ref 0–99)
Triglycerides: 175 mg/dL — ABNORMAL HIGH (ref 0–149)
VLDL Cholesterol Cal: 31 mg/dL (ref 5–40)

## 2021-02-19 NOTE — Patient Instructions (Signed)
Medication Instructions:  °Your physician recommends that you continue on your current medications as directed. Please refer to the Current Medication list given to you today. ° °*If you need a refill on your cardiac medications before your next appointment, please call your pharmacy* ° ° °Lab Work: °Your physician recommends that you return for lab work in: Labs today: CMP, Lipids °If you have labs (blood work) drawn today and your tests are completely normal, you will receive your results only by: °MyChart Message (if you have MyChart) OR °A paper copy in the mail °If you have any lab test that is abnormal or we need to change your treatment, we will call you to review the results. ° ° °Testing/Procedures: °None ° ° °Follow-Up: °At [facility], you and your health needs are our priority.  As part of our continuing mission to provide you with exceptional heart care, we have created designated Provider Care Teams.  These Care Teams include your primary Cardiologist (physician) and Advanced Practice Providers (APPs -  Physician Assistants and Nurse Practitioners) who all work together to provide you with the care you need, when you need it. ° °We recommend signing up for the patient portal called "MyChart".  Sign up information is provided on this After Visit Summary.  MyChart is used to connect with patients for Virtual Visits (Telemedicine).  Patients are able to view lab/test results, encounter notes, upcoming appointments, etc.  Non-urgent messages can be sent to your provider as well.   °To learn more about what you can do with MyChart, go to [email or web address].   ° °Your next appointment:   °6 month(s) ° °The format for your next appointment:   °In Person ° °Provider:   °Brian Munley, MD  ° ° °Other Instructions °None ° °

## 2021-02-20 ENCOUNTER — Telehealth: Payer: Self-pay

## 2021-02-20 NOTE — Telephone Encounter (Signed)
-----   Message from Richardo Priest, MD sent at 02/20/2021 12:44 PM EST ----- No changes

## 2021-02-20 NOTE — Telephone Encounter (Signed)
Spoke with patient regarding results and recommendation.  Patient verbalizes understanding and is agreeable to plan of care. Advised patient to call back with any issues or concerns.  

## 2021-02-20 NOTE — Telephone Encounter (Signed)
Left message on patients voicemail to please return our call.   

## 2021-04-16 ENCOUNTER — Telehealth: Payer: Self-pay | Admitting: Cardiology

## 2021-04-16 ENCOUNTER — Other Ambulatory Visit: Payer: Self-pay | Admitting: Cardiology

## 2021-04-16 DIAGNOSIS — I482 Chronic atrial fibrillation, unspecified: Secondary | ICD-10-CM

## 2021-04-16 MED ORDER — ELIQUIS 5 MG PO TABS: 5.0000 mg | ORAL_TABLET | Freq: Two times a day (BID) | ORAL | 1 refills | Status: DC

## 2021-04-16 NOTE — Telephone Encounter (Signed)
Prescription refill request for Eliquis received. ?Indication: Afib  ?Last office visit:02/19/21 [facility]) ?Scr: 1.08 (02/19/21) ?Age: 76 ?Weight: 65.8kg ? ?Appropriate dose and refill sent to requested pharmacy.  ?

## 2021-04-16 NOTE — Telephone Encounter (Signed)
?*  STAT* If patient is at the pharmacy, call can be transferred to refill team. ? ? ?1. Which medications need to be refilled? (please list name of each medication and dose if known) ELIQUIS 5 MG TABS tablet (Expired) ? ?2. Which pharmacy/location (including street and city if local pharmacy) is medication to be sent to? [facility] [place], [place] ? ?3. Do they need a 30 day or 90 day supply? 90 ? ?

## 2021-06-03 DIAGNOSIS — J329 Chronic sinusitis, unspecified: Secondary | ICD-10-CM | POA: Diagnosis not present

## 2021-06-03 DIAGNOSIS — J302 Other seasonal allergic rhinitis: Secondary | ICD-10-CM | POA: Diagnosis not present

## 2021-06-03 DIAGNOSIS — J40 Bronchitis, not specified as acute or chronic: Secondary | ICD-10-CM | POA: Diagnosis not present

## 2021-07-01 ENCOUNTER — Telehealth: Payer: Self-pay

## 2021-07-01 NOTE — Telephone Encounter (Signed)
Patient approved for Eliquis from [employer] Patient assistance for 06/28/2021 through 01/12/2022  Case# [id number]  Copy of approval placed in patients chart

## 2021-07-31 ENCOUNTER — Other Ambulatory Visit: Payer: Self-pay | Admitting: Cardiology

## 2021-08-12 NOTE — Progress Notes (Unsigned)
Cardiology Office Note:    Date:  08/13/2021   ID:  Gloria Harrington, DOB [birth year], MRN [id number]  PCP:  Mateo Flow, MD  Cardiologist:  Shirlee More, MD    Referring MD: Mateo Flow, MD    ASSESSMENT:    1. Chronic atrial fibrillation ([place])   2. Chronic anticoagulation   3. Hypertensive heart disease with chronic combined systolic and diastolic congestive heart failure ([place])   4. Nonrheumatic mitral valve regurgitation   5. Hyperlipidemia, unspecified hyperlipidemia type    PLAN:    In order of problems listed above:  Gloria Harrington continues to do well her atrial fibrillation is rate controlled with her beta-blocker and she will continue her anticoagulant without bleeding complication Heart failure is well compensated she will continue her low-dose diuretic beta-blocker and Entresto We will recheck echocardiogram to see if she needs further intensive therapy for cardiomyopathy and heart failure Recheck echocardiogram for functional MR Continue her statin for hyperlipidemia   Next appointment: 6 months   Medication Adjustments/Labs and Tests Ordered: Current medicines are reviewed at length with the patient today.  Concerns regarding medicines are outlined above.  No orders of the defined types were placed in this encounter.  No orders of the defined types were placed in this encounter.   Chief Complaint  Patient presents with   Follow-up   Atrial Fibrillation   Congestive Heart Failure    History of Present Illness:    Gloria Harrington is a 76 y.o. female with a hx of longstanding permanent atrial fibrillation with chronic anticoagulation and rate control hyperlipidemia hypertension with cardiomyopathy most recent ejection fraction January 2000 2155 to 60% and mitral regurgitation last seen 02/19/2021.  Compliance with diet, lifestyle and medications: Yes  She continues to do well.  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   Cardiomyopathy, secondary ([place]) 12/24/2016   Carotid atherosclerosis 12/24/2016   Chronic anticoagulation 08/14/2014   Chronic atrial fibrillation ([place]) 08/14/2014   Overview:  CHADS2 vasc score=4   Chronic combined systolic and diastolic heart failure ([place]) 12/24/2016   EF 40% felt to be due to tachycardia   Coronary artery calcification seen on CT scan 08/14/2014   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   Left breast with lumpectomy and planned XRT   Drug-induced hyperkalemia 03/24/2016   Essential hypertension 08/14/2014   Hyperkalemia 03/24/2016   Hyperlipidemia 08/14/2014   Hypertensive heart disease 08/14/2014   Hypothyroid 09/17/2016   Mitral regurgitation 04/21/2017   Osteopenia after menopause 08/24/2020   PAF (paroxysmal atrial fibrillation) ([place]) 08/14/2014   Overview:  CHADS2 vasc score=4   Palpitation 09/17/2016   Primary osteoarthritis of first carpometacarpal joint of right hand 08/28/2017   PUD (peptic ulcer disease) 12/24/2016    Past Surgical History:  Procedure Laterality Date   ARTHROPLASTY     for hammertoe   BREAST BIOPSY     HALLUX VALGUS CORRECTION     open tenotomy of extensor second toe     TUBAL LIGATION      Current Medications: Current Meds  Medication Sig   calcium-vitamin D (OSCAL WITH D) 500-200 MG-UNIT  tablet Take 1 tablet by mouth 2 (two) times daily.   dicyclomine (BENTYL) 10 MG capsule Take 10 mg by mouth 2 (two) times daily as needed for pain.   ELIQUIS 5 MG TABS tablet Take 1 tablet (5 mg total) by mouth 2 (two) times daily.   furosemide (LASIX) 20 MG tablet TAKE 1 TABLET BY MOUTH DAILY. TAKE AN EXTRA  TABLET IF YOU WEIGH 138 OR GREATER   levothyroxine (SYNTHROID, LEVOTHROID) 75 MCG tablet Take 75 mcg by mouth daily.   metoprolol tartrate (LOPRESSOR) 50 MG tablet Take 1 tablet (50 mg total) by mouth 2 (two) times daily.   Multiple Vitamin (MULTI-VITAMINS) TABS Take 1 tablet by mouth daily.   Omega-3 Fatty Acids (FISH OIL) 1000 MG CAPS Take 2 capsules by mouth 2 (two) times daily.   pantoprazole (PROTONIX) 40 MG tablet Take 40 mg by mouth daily.   pravastatin (PRAVACHOL) 40 MG tablet Take 1 tablet (40 mg total) by mouth daily.   sacubitril-valsartan (ENTRESTO) 49-51 MG Take 1 tablet by mouth 2 (two) times daily.     Allergies:   Sulfamethoxazole-trimethoprim, Clindamycin, Nitrofurantoin, Penicillins, Prednisone, and Latex   Social History   Socioeconomic History   Marital status: Married    Spouse name: Not on file   Number of children: Not on file   Years of education: Not on file   Highest education level: Not on file  Occupational History   Not on file  Tobacco Use   Smoking status: Never    Passive exposure: Never   Smokeless tobacco: Never  Vaping Use   Vaping Use: Never used  Substance and Sexual Activity   Alcohol use: No   Drug use: No   Sexual activity: Not on file  Other Topics Concern   Not on file  Social History Narrative   Not on file   Social Determinants of Health   Financial Resource Strain: Not on file  Food Insecurity: Not on file  Transportation Needs: Not on file  Physical Activity: Not on file  Stress: Not on file  Social Connections: Not on file     Family History: The patient's family history includes Breast cancer in her sister and sister; Hyperlipidemia in her sister and sister. ROS:   Please see the history of present illness.    All other systems reviewed and are negative.  EKGs/Labs/Other Studies Reviewed:    The following studies were reviewed today:    Recent Labs: 09/12/2020: Hemoglobin 13.0; Platelets 185 02/19/2021: ALT 22;  BUN 18; Creatinine, Ser 1.08; Potassium 4.4; Sodium 141  Recent Lipid Panel    Component Value Date/Time   CHOL 224 (H) 02/19/2021 1047   TRIG 175 (H) 02/19/2021 1047   HDL 55 02/19/2021 1047   CHOLHDL 4.1 02/19/2021 1047   LDLCALC 138 (H) 02/19/2021 1047    Physical Exam:    VS:  BP 122/72   Pulse 84   Ht '5\' 5"'$  (1.651 m)   Wt 145 lb 12.8 oz (66.1 kg)   LMP  (LMP Unknown)   SpO2 96%   BMI 24.26 kg/m     Wt Readings from Last 3 Encounters:  08/13/21 145 lb 12.8 oz (66.1 kg)  02/19/21 145 lb (65.8 kg)  08/21/20 143 lb 9.6 oz (65.1 kg)     GEN: She appears healthy well nourished, well developed in no acute distress HEENT: Normal NECK: No JVD; No carotid bruits LYMPHATICS: No lymphadenopathy CARDIAC: Irregular rate and rhythm 1-2 of 6 MR at the  apex no gallop  RESPIRATORY:  Clear to auscultation without rales, wheezing or rhonchi  ABDOMEN: Soft, non-tender, non-distended MUSCULOSKELETAL:  No edema; No deformity  SKIN: Warm and dry NEUROLOGIC:  Alert and oriented x 3 PSYCHIATRIC:  Normal affect    Signed, Shirlee More, MD  08/13/2021 10:02 AM    [place]

## 2021-08-13 ENCOUNTER — Encounter: Payer: Self-pay | Admitting: Cardiology

## 2021-08-13 ENCOUNTER — Ambulatory Visit: Payer: Medicare Other | Admitting: Cardiology

## 2021-08-13 VITALS — BP 122/72 | HR 84 | Ht 65.0 in | Wt 145.8 lb

## 2021-08-13 DIAGNOSIS — I5042 Chronic combined systolic (congestive) and diastolic (congestive) heart failure: Secondary | ICD-10-CM

## 2021-08-13 DIAGNOSIS — I482 Chronic atrial fibrillation, unspecified: Secondary | ICD-10-CM

## 2021-08-13 DIAGNOSIS — I110 Hypertensive heart disease with heart failure: Secondary | ICD-10-CM | POA: Diagnosis not present

## 2021-08-13 DIAGNOSIS — Z7901 Long term (current) use of anticoagulants: Secondary | ICD-10-CM

## 2021-08-13 DIAGNOSIS — I340 Nonrheumatic mitral (valve) insufficiency: Secondary | ICD-10-CM | POA: Diagnosis not present

## 2021-08-13 DIAGNOSIS — E785 Hyperlipidemia, unspecified: Secondary | ICD-10-CM | POA: Diagnosis not present

## 2021-08-13 NOTE — Patient Instructions (Signed)
Medication Instructions:  Your physician recommends that you continue on your current medications as directed. Please refer to the Current Medication list given to you today.  *If you need a refill on your cardiac medications before your next appointment, please call your pharmacy*   Lab Work: None If you have labs (blood work) drawn today and your tests are completely normal, you will receive your results only by: MyChart Message (if you have MyChart) OR A paper copy in the mail If you have any lab test that is abnormal or we need to change your treatment, we will call you to review the results.   Testing/Procedures: Your physician has requested that you have an echocardiogram. Echocardiography is a painless test that uses sound waves to create images of your heart. It provides your doctor with information about the size and shape of your heart and how well your heart's chambers and valves are working. This procedure takes approximately one hour. There are no restrictions for this procedure.    Follow-Up: At [facility], you and your health needs are our priority.  As part of our continuing mission to provide you with exceptional heart care, we have created designated Provider Care Teams.  These Care Teams include your primary Cardiologist (physician) and Advanced Practice Providers (APPs -  Physician Assistants and Nurse Practitioners) who all work together to provide you with the care you need, when you need it.  We recommend signing up for the patient portal called "MyChart".  Sign up information is provided on this After Visit Summary.  MyChart is used to connect with patients for Virtual Visits (Telemedicine).  Patients are able to view lab/test results, encounter notes, upcoming appointments, etc.  Non-urgent messages can be sent to your provider as well.   To learn more about what you can do with MyChart, go to [email or web address].    Your next appointment:   6  month(s)  The format for your next appointment:   In Person  Provider:   Brian Munley, MD    Other Instructions None  Important Information About Sugar       

## 2021-08-14 ENCOUNTER — Ambulatory Visit (INDEPENDENT_AMBULATORY_CARE_PROVIDER_SITE_OTHER): Payer: Medicare Other

## 2021-08-14 DIAGNOSIS — I340 Nonrheumatic mitral (valve) insufficiency: Secondary | ICD-10-CM

## 2021-08-14 DIAGNOSIS — I5042 Chronic combined systolic (congestive) and diastolic (congestive) heart failure: Secondary | ICD-10-CM | POA: Diagnosis not present

## 2021-08-14 DIAGNOSIS — I110 Hypertensive heart disease with heart failure: Secondary | ICD-10-CM

## 2021-08-14 DIAGNOSIS — I482 Chronic atrial fibrillation, unspecified: Secondary | ICD-10-CM

## 2021-08-14 DIAGNOSIS — Z7901 Long term (current) use of anticoagulants: Secondary | ICD-10-CM

## 2021-08-14 DIAGNOSIS — E785 Hyperlipidemia, unspecified: Secondary | ICD-10-CM | POA: Diagnosis not present

## 2021-08-14 LAB — ECHOCARDIOGRAM COMPLETE
Area-P 1/2: 3.91 cm2
MV M vel: 4.21 m/s
MV Peak grad: 70.9 mmHg
P 1/2 time: 701 msec
S' Lateral: 2.6 cm

## 2021-08-21 ENCOUNTER — Other Ambulatory Visit: Payer: Self-pay

## 2021-08-21 ENCOUNTER — Encounter: Payer: Self-pay | Admitting: Oncology

## 2021-08-21 ENCOUNTER — Inpatient Hospital Stay: Payer: Medicare Other | Attending: Oncology | Admitting: Oncology

## 2021-08-21 ENCOUNTER — Telehealth: Payer: Self-pay | Admitting: Oncology

## 2021-08-21 VITALS — BP 139/65 | HR 81 | Temp 97.6°F | Resp 18 | Ht 65.0 in | Wt 144.2 lb

## 2021-08-21 DIAGNOSIS — D0512 Intraductal carcinoma in situ of left breast: Secondary | ICD-10-CM

## 2021-08-21 NOTE — Progress Notes (Signed)
[facility]  [address] [place],  [place]  [address] [phone]  Clinic Day:  08/21/2021  Referring physician: Mateo Flow, MD  This document serves as a record of services personally performed by Hosie Poisson, MD. It was created on their behalf by [facility], a trained medical scribe. The creation of this record is based on the scribe's personal observations and the provider's statements to them.  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  This was found through a screening mammogram.   She was treated with lumpectomy.  Pathology revealed 44 mm, grade 2, ductal carcinoma in situ with apocrine features involving a complex sclerosing lesion with multiple foci of sclerosing adenosis.  No invasive cancer was seen. She had a history of 2 prior benign breast biopsies.  She received adjuvant radiation completed in January.  Bone density scan in December revealed osteopenia with a T-score of the spine of -1.3 which is improved by 2.7%, and the T-score of the femur at -2.0 which represents a 13.3% worsening.  She has multiple other medical comorbidities including atrial fibrillation, heart failure and history of gastric culture.  She  declined chemoprevention.  She underwent testing for hereditary cancer syndromes with the [employer] Hereditary Cancer panel.  This did not reveal any clinically significant mutation, but she did have 4 variants of uncertain significance in the APC, DICER1, TERT, and TSC1 genes.  Bilateral diagnostic mammogram in September 2020 did not reveal any evidence of malignancy.  She has CHF and sees Dr. Bettina Gavia.  She states her disease has been  well controlled with Entresto.    INTERVAL HISTORY:  Hermelinda is here for routine follow up. Her mammogram is set for October 2023 and Dr.Morgan will see her after that. Her routine physical with Dr.Khan is scheduled for August 29, 2021. She asked about a referral for an OBGYN in addition to her family doctor. I agree she should have a pelvic exam and so we will make the referral. She saw her cardiologist last week. 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 Her  appetite is good, and she is eating well. Her weight remains stable and has lost 1 pound since her last visit. Wt Readings from Last 3 Encounters:  08/21/21 144 lb 3.2 oz (65.4 kg)  08/13/21 145 lb 12.8 oz (66.1 kg)  02/19/21 145 lb (65.8 kg)  She denies fever, chills or other signs of infection.  She denies nausea, vomiting, bowel issues, or abdominal pain.  She denies sore throat, cough, dyspnea, or chest pain.  REVIEW OF SYSTEMS:  Review of Systems  Constitutional: Negative.  Negative for appetite change, chills, fatigue, fever and unexpected weight change.  HENT:  Negative.    Eyes: Negative.   Respiratory: Negative.  Negative for chest tightness, cough, hemoptysis, shortness of breath and wheezing.   Cardiovascular: Negative.  Negative for chest pain, leg swelling and palpitations.  Gastrointestinal: Negative.  Negative for abdominal distention, abdominal pain, blood in stool, constipation, diarrhea, nausea and vomiting.  Endocrine: Negative.   Genitourinary: Negative.  Negative for difficulty urinating, dysuria, frequency  and hematuria.   Musculoskeletal: Negative.  Negative for arthralgias, back pain, flank pain, gait problem and myalgias.  Skin: Negative.   Neurological: Negative.  Negative for dizziness, extremity weakness, gait problem, headaches, light-headedness, numbness, seizures and speech difficulty.  Hematological: Negative.    Psychiatric/Behavioral: Negative.  Negative for depression and sleep disturbance. The patient is not nervous/anxious.      VITALS:  There were no vitals taken for this visit.  Wt Readings from Last 3 Encounters:  08/13/21 145 lb 12.8 oz (66.1 kg)  02/19/21 145 lb (65.8 kg)  08/21/20 143 lb 9.6 oz (65.1 kg)    There is no height or weight on file to calculate BMI.  Performance status (ECOG): 0 - Asymptomatic  PHYSICAL EXAM:  Physical Exam Constitutional:      General: She is not in acute distress.    Appearance: Normal appearance. She is normal weight.  HENT:     Head: Normocephalic and atraumatic.  Eyes:     General: No scleral icterus.    Extraocular Movements: Extraocular movements intact.     Conjunctiva/sclera: Conjunctivae normal.     Pupils: Pupils are equal, round, and reactive to light.  Cardiovascular:     Rate and Rhythm: Normal rate and regular rhythm.     Pulses: Normal pulses.     Heart sounds: Normal heart sounds. No murmur heard.    No friction rub. No gallop.  Pulmonary:     Effort: Pulmonary effort is normal. No respiratory distress.     Breath sounds: Normal breath sounds.  Chest:  Breasts:    Right: Normal.     Left: Normal.     Comments: Breasts are without masses. Well healed scar in upper left breast and is slightly nodular. Mild yeast rash inframammary fold . Abdominal:     General: Bowel sounds are normal. There is no distension.     Palpations: Abdomen is soft. There is no hepatomegaly, splenomegaly or mass.     Tenderness: There is no abdominal tenderness.     Comments: Scar of the upper mid abdomen where her lesion was removed.  Musculoskeletal:        General: Normal range of motion.     Cervical back: Normal range of motion and neck supple.     Right lower leg: No edema.     Left lower leg: No edema.  Lymphadenopathy:     Cervical: No cervical adenopathy.  Skin:    General: Skin is warm and dry.  Neurological:     General: No  focal deficit present.     Mental Status: She is alert and oriented to person, place, and time. Mental status is at baseline.  Psychiatric:        Mood and Affect: Mood normal.        Behavior: Behavior normal.        Thought Content: Thought content normal.        Judgment: Judgment normal.     LABS:      Latest Ref Rng & Units 09/12/2020   12:00 AM  CBC  WBC  4.6   Hemoglobin 12.0 - 16.0 13.0   Hematocrit 36 - 46 37   Platelets 150 - 399 185       Latest Ref Rng & Units 02/19/2021   10:47 AM 09/12/2020   12:00 AM 02/14/2020    9:29 AM  CMP  Glucose 70 - 99 mg/dL 107   88   BUN 8 - 27 mg/dL  $'18  24  16   'R$ Creatinine 0.57 - 1.00 mg/dL 1.08  1.0  1.10   Sodium 134 - 144 mmol/L 141  142  142   Potassium 3.5 - 5.2 mmol/L 4.4  4.2  4.4   Chloride 96 - 106 mmol/L 104  105  103   CO2 20 - 29 mmol/L '25  27  24   '$ Calcium 8.7 - 10.3 mg/dL 9.9  9.8  9.7   Total Protein 6.0 - 8.5 g/dL 7.1   7.0   Total Bilirubin 0.0 - 1.2 mg/dL 0.8   0.7   Alkaline Phos 44 - 121 IU/L 63  51  68   AST 0 - 40 IU/L '20  17  24   '$ ALT 0 - 32 IU/L '22  20  28     '$ STUDIES:  ECHOCARDIOGRAM COMPLETE  Result Date: 08/14/2021    ECHOCARDIOGRAM REPORT   Patient Name:   SHEYLIN SCHARNHORST CALLICUTT Buerkle Date of Exam: 08/14/2021 Medical Rec #:  [id number]                    Height:       65.0 in Accession #:    [id number]                   Weight:       145.8 lb Date of Birth:  [birth year]                    BSA:          1.729 m Patient Age:    23 years                     BP:           122/72 mmHg Patient Gender: F                            HR:           73 bpm. 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 CHF, Arrythmias:Atrial Fibrillation; Risk                 Factors:Hypertension and Dyslipidemia.  Sonographer:    Philipp Deputy RDCS Referring Phys: [id number] [place]  1. Left ventricular ejection fraction, by estimation, is 60 to 65%. The left ventricle has normal function. The left ventricle has no regional wall motion abnormalities. Left ventricular diastolic parameters are indeterminate.  2. Right  ventricular systolic function is normal. The right ventricular size is normal. There is normal pulmonary artery systolic pressure.  3. The mitral valve is normal in structure. Mild mitral valve regurgitation. No evidence of mitral stenosis.  4. The aortic valve is normal in structure. Aortic valve regurgitation is mild. No aortic stenosis is present.  5. The inferior vena cava is normal in size with greater than 50% respiratory variability, suggesting right atrial pressure of 3 mmHg. FINDINGS  Left Ventricle: Left ventricular ejection fraction, by estimation, is 60 to 65%. The left ventricle has normal function. The left ventricle has no regional wall motion abnormalities. The left ventricular internal cavity size was normal in size. There is  no left ventricular hypertrophy. Left ventricular diastolic parameters are indeterminate. Right Ventricle: The right ventricular size is normal. No increase in right ventricular wall thickness. Right ventricular systolic function is normal. There is normal pulmonary artery systolic pressure. The tricuspid regurgitant velocity is 2.49 m/s, and  with an assumed right atrial pressure of 3 mmHg, the estimated right ventricular systolic pressure is 53.2 mmHg. Left Atrium: Left  atrial size was normal in size. Right Atrium: Right atrial size was normal in size. Pericardium: There is no evidence of pericardial effusion. Mitral Valve: The mitral valve is normal in structure. Mild mitral valve regurgitation. No evidence of mitral valve stenosis. Tricuspid Valve: The tricuspid valve is normal in structure. Tricuspid valve regurgitation is mild . No evidence of tricuspid stenosis. Aortic Valve: The aortic valve is normal in structure. Aortic valve regurgitation is mild. Aortic regurgitation PHT measures 701 msec. No aortic stenosis is present. Pulmonic Valve: The pulmonic valve was normal in structure. Pulmonic valve regurgitation is not visualized. No evidence of pulmonic stenosis. Aorta: The aortic root is normal in size and structure. Venous: The inferior vena cava is normal in size with greater than 50% respiratory variability, suggesting right atrial pressure of 3 mmHg. IAS/Shunts: No atrial level shunt detected by color flow Doppler.  LEFT VENTRICLE PLAX 2D LVIDd:         3.70 cm   Diastology LVIDs:         2.60 cm   LV e' medial:    9.25 cm/s LV PW:         0.90 cm   LV E/e' medial:  10.7 LV IVS:        0.90 cm   LV e' lateral:   10.20 cm/s LVOT diam:     1.70 cm   LV E/e' lateral: 9.7 LV SV:         40 LV SV Index:   23 LVOT Area:     2.27 cm  RIGHT VENTRICLE            IVC RV Basal diam:  2.60 cm    IVC diam: 1.40 cm RV S prime:     7.07 cm/s TAPSE (M-mode): 1.2 cm LEFT ATRIUM             Index        RIGHT ATRIUM           Index LA diam:        4.30 cm 2.49 cm/m   RA Area:     14.10 cm LA Vol (A2C):   40.6 ml 23.48 ml/m  RA Volume:   29.90 ml  17.29 ml/m LA Vol (A4C):   40.1 ml 23.19 ml/m LA Biplane Vol: 40.6 ml 23.48 ml/m  AORTIC VALVE LVOT Vmax:   79.80 cm/s LVOT  Vmean:  55.700 cm/s LVOT VTI:    0.178 m AI PHT:      701 msec  AORTA Ao Root diam: 2.70 cm Ao Asc diam:  3.10 cm Ao Desc diam: 1.60 cm MITRAL VALVE               TRICUSPID VALVE MV Area (PHT): 3.91 cm    TR Peak  grad:   24.8 mmHg MV Decel Time: 194 msec    TR Vmax:        249.00 cm/s MR Peak grad: 70.9 mmHg MR Vmax:      421.00 cm/s  SHUNTS MV E velocity: 99.40 cm/s  Systemic VTI:  0.18 m                            Systemic Diam: 1.70 cm Jenne Campus MD Electronically signed by Jenne Campus MD Signature Date/Time: 08/14/2021/4:59:25 PM    Final      EXAM: 11/02/2020 DIGITAL DIAGNOSTIC BILATERAL MAMMOGRAM WITH TOMO AND CAD  COMPARISON:  Previous exam(s).  ACR Breast Density Category c: The breast tissue is heterogeneously dense, which may obscure small masses.  FINDINGS: Stable postlumpectomy changes left breast. No new masses, calcifications or nonsurgical distortion identified within either breast.  Mammographic images were processed with CAD.  IMPRESSION: No mammographic evidence for malignancy. Stable lumpectomy changes left breast.   Allergies:  Allergies  Allergen Reactions   Sulfamethoxazole-Trimethoprim Swelling    Tongue swelling   Clindamycin Other (See Comments)    unknown unknown   Nitrofurantoin Other (See Comments)    unknown unknown   Penicillins Itching    On feet   Prednisone Other (See Comments)    Fever and shaking   Latex Rash    Current Medications: Current Outpatient Medications  Medication Sig Dispense Refill   calcium-vitamin D (OSCAL WITH D) 500-200 MG-UNIT tablet Take 1 tablet by mouth 2 (two) times daily.     dicyclomine (BENTYL) 10 MG capsule Take 10 mg by mouth 2 (two) times daily as needed for pain.     ELIQUIS 5 MG TABS tablet Take 1 tablet (5 mg total) by mouth 2 (two) times daily. 180 tablet 1   furosemide (LASIX) 20 MG tablet TAKE 1 TABLET BY MOUTH DAILY. TAKE AN EXTRA TABLET IF YOU WEIGH 138 OR GREATER 180 tablet 2   levothyroxine (SYNTHROID, LEVOTHROID) 75 MCG tablet Take 75 mcg by mouth daily.  0   metoprolol tartrate (LOPRESSOR) 50 MG tablet Take 1 tablet (50 mg total) by mouth 2 (two) times daily. 180 tablet 2   Multiple Vitamin  (MULTI-VITAMINS) TABS Take 1 tablet by mouth daily.     Omega-3 Fatty Acids (FISH OIL) 1000 MG CAPS Take 2 capsules by mouth 2 (two) times daily.     pantoprazole (PROTONIX) 40 MG tablet Take 40 mg by mouth daily.  12   pravastatin (PRAVACHOL) 40 MG tablet Take 1 tablet (40 mg total) by mouth daily. 90 tablet 1   sacubitril-valsartan (ENTRESTO) 49-51 MG Take 1 tablet by mouth 2 (two) times daily. 180 tablet 3   No current facility-administered medications for this visit.     ASSESSMENT & PLAN:   Assessment:  Adalei Novell Callicutt Ellwanger is a 76 y.o. female with ductal carcinoma in situ of the left breast in October of 2018.  She remains without evidence of disease.    Plan: She sees Dr. Lilia Pro in October with bilateral mammogram.  She also  has her routine physical with Dr.Khan later this month. We will plan to see her back in 9 months for re-examination. We will then go to yearly exams so that it will alternate with Dr.Morgan's follow up in the fall. The patient understands the plans discussed today and is in agreement with them.  She knows to contact our office if she develops concerns regarding her breast cancer.   I provided 15 minutes of face-to-face time during this this encounter and > 50% was spent counseling as documented under my assessment and plan.    Derwood Kaplan, MD [facility] [address] [place] [address] Dept: [phone] Dept Fax: [phone]      Ninetta Lights as a scribe for Derwood Kaplan, MD.,have documented all relevant documentation on the behalf of Derwood Kaplan, MD,as directed by  Derwood Kaplan, MD while in the presence of Derwood Kaplan, MD.   I have reviewed this report as typed by the medical scribe, and it is complete and accurate.

## 2021-08-21 NOTE — Telephone Encounter (Signed)
Per 08/21/21 los next appt scheduled and confirmed with patient

## 2021-08-23 ENCOUNTER — Other Ambulatory Visit: Payer: Self-pay

## 2021-08-23 DIAGNOSIS — D0512 Intraductal carcinoma in situ of left breast: Secondary | ICD-10-CM

## 2021-08-26 ENCOUNTER — Telehealth: Payer: Self-pay | Admitting: Cardiology

## 2021-08-26 NOTE — Telephone Encounter (Signed)
Pt would like a callback regarding labs that she need to have done. Please advise

## 2021-08-26 NOTE — Telephone Encounter (Signed)
Pt would like a callback regarding getting labs that she needs done. Please advise

## 2021-08-29 DIAGNOSIS — Z6824 Body mass index (BMI) 24.0-24.9, adult: Secondary | ICD-10-CM | POA: Diagnosis not present

## 2021-08-29 DIAGNOSIS — Z780 Asymptomatic menopausal state: Secondary | ICD-10-CM | POA: Diagnosis not present

## 2021-08-29 DIAGNOSIS — E782 Mixed hyperlipidemia: Secondary | ICD-10-CM | POA: Diagnosis not present

## 2021-08-29 DIAGNOSIS — Z79899 Other long term (current) drug therapy: Secondary | ICD-10-CM | POA: Diagnosis not present

## 2021-08-29 DIAGNOSIS — E039 Hypothyroidism, unspecified: Secondary | ICD-10-CM | POA: Diagnosis not present

## 2021-08-29 DIAGNOSIS — M8580 Other specified disorders of bone density and structure, unspecified site: Secondary | ICD-10-CM | POA: Diagnosis not present

## 2021-08-29 DIAGNOSIS — I4891 Unspecified atrial fibrillation: Secondary | ICD-10-CM | POA: Diagnosis not present

## 2021-08-29 DIAGNOSIS — I10 Essential (primary) hypertension: Secondary | ICD-10-CM | POA: Diagnosis not present

## 2021-08-29 DIAGNOSIS — Z0000 Encounter for general adult medical examination without abnormal findings: Secondary | ICD-10-CM | POA: Diagnosis not present

## 2021-08-29 DIAGNOSIS — E790 Hyperuricemia without signs of inflammatory arthritis and tophaceous disease: Secondary | ICD-10-CM | POA: Diagnosis not present

## 2021-08-29 DIAGNOSIS — K279 Peptic ulcer, site unspecified, unspecified as acute or chronic, without hemorrhage or perforation: Secondary | ICD-10-CM | POA: Diagnosis not present

## 2021-08-29 DIAGNOSIS — I509 Heart failure, unspecified: Secondary | ICD-10-CM | POA: Diagnosis not present

## 2021-08-29 LAB — TSH: TSH: 0.81 (ref 0.41–5.90)

## 2021-08-29 LAB — BASIC METABOLIC PANEL
BUN: 18 (ref 4–21)
CO2: 28 — AB (ref 13–22)
Chloride: 104 (ref 99–108)
Creatinine: 0.9 (ref 0.5–1.1)
Glucose: 105
Potassium: 4.2 mEq/L (ref 3.5–5.1)
Sodium: 142 (ref 137–147)

## 2021-08-29 LAB — COMPREHENSIVE METABOLIC PANEL
Albumin: 4.6 (ref 3.5–5.0)
Calcium: 10.1 (ref 8.7–10.7)

## 2021-08-29 LAB — CBC AND DIFFERENTIAL
HCT: 39 (ref 36–46)
Hemoglobin: 13.7 (ref 12.0–16.0)
Neutrophils Absolute: 4.6
Platelets: 204 10*3/uL (ref 150–400)
WBC: 5.7

## 2021-08-29 LAB — HEPATIC FUNCTION PANEL
ALT: 18 U/L (ref 7–35)
AST: 17 (ref 13–35)
Alkaline Phosphatase: 53 (ref 25–125)
Bilirubin, Total: 1.1

## 2021-08-29 LAB — LIPID PANEL
Cholesterol: 227 — AB (ref 0–200)
HDL: 51 (ref 35–70)
LDL Cholesterol: 147
LDl/HDL Ratio: 3
Triglycerides: 143 (ref 40–160)

## 2021-08-29 LAB — CBC: RBC: 4.46 (ref 3.87–5.11)

## 2021-09-02 DIAGNOSIS — H40013 Open angle with borderline findings, low risk, bilateral: Secondary | ICD-10-CM | POA: Diagnosis not present

## 2021-09-02 NOTE — Telephone Encounter (Signed)
Patient will forward recent labs done with her primary care on 08/17. And if any other labs are needed, she will arrange with our office.

## 2021-09-03 LAB — T4, FREE: Free T4: 1.31

## 2021-09-03 LAB — BRAIN NATRIURETIC PEPTIDE: B Natriuretic Peptide: 287.6

## 2021-09-03 LAB — URIC ACID: Uric Acid: 7.6

## 2021-10-15 DIAGNOSIS — Z8711 Personal history of peptic ulcer disease: Secondary | ICD-10-CM | POA: Diagnosis not present

## 2021-11-12 ENCOUNTER — Other Ambulatory Visit: Payer: Self-pay

## 2021-11-12 DIAGNOSIS — Z1231 Encounter for screening mammogram for malignant neoplasm of breast: Secondary | ICD-10-CM | POA: Diagnosis not present

## 2021-11-12 DIAGNOSIS — R928 Other abnormal and inconclusive findings on diagnostic imaging of breast: Secondary | ICD-10-CM | POA: Diagnosis not present

## 2021-11-12 MED ORDER — ENTRESTO 49-51 MG PO TABS: 1.0000 | ORAL_TABLET | Freq: Two times a day (BID) | ORAL | 3 refills | Status: DC

## 2021-11-12 NOTE — Telephone Encounter (Signed)
Refill Entresto sent to pharmacy

## 2021-11-28 DIAGNOSIS — R92321 Mammographic fibroglandular density, right breast: Secondary | ICD-10-CM | POA: Diagnosis not present

## 2021-11-28 DIAGNOSIS — R928 Other abnormal and inconclusive findings on diagnostic imaging of breast: Secondary | ICD-10-CM | POA: Diagnosis not present

## 2021-11-28 DIAGNOSIS — Z23 Encounter for immunization: Secondary | ICD-10-CM | POA: Diagnosis not present

## 2021-11-28 DIAGNOSIS — N6489 Other specified disorders of breast: Secondary | ICD-10-CM | POA: Diagnosis not present

## 2021-12-02 ENCOUNTER — Other Ambulatory Visit: Payer: Self-pay | Admitting: Pharmacist

## 2021-12-02 DIAGNOSIS — I482 Chronic atrial fibrillation, unspecified: Secondary | ICD-10-CM

## 2021-12-02 DIAGNOSIS — I480 Paroxysmal atrial fibrillation: Secondary | ICD-10-CM

## 2021-12-02 MED ORDER — ELIQUIS 5 MG PO TABS: 5.0000 mg | ORAL_TABLET | Freq: Two times a day (BID) | ORAL | 3 refills | Status: DC

## 2021-12-03 DIAGNOSIS — Z853 Personal history of malignant neoplasm of breast: Secondary | ICD-10-CM | POA: Diagnosis not present

## 2021-12-03 DIAGNOSIS — R928 Other abnormal and inconclusive findings on diagnostic imaging of breast: Secondary | ICD-10-CM | POA: Diagnosis not present

## 2021-12-09 ENCOUNTER — Telehealth: Payer: Self-pay | Admitting: Cardiology

## 2021-12-09 NOTE — Telephone Encounter (Signed)
Pt advised that we will complete Dr. Joya Gaskins part of the application once we receive the pt's portion. Pt verbalized understanding and had no additional questions.

## 2021-12-09 NOTE — Telephone Encounter (Signed)
Pt calling wanting to know Dr. Bettina Gavia got her [employer] pt assistance form in the mail.

## 2021-12-20 ENCOUNTER — Telehealth: Payer: Self-pay | Admitting: Cardiology

## 2021-12-20 NOTE — Telephone Encounter (Signed)
Pt c/o medication issue:  1. Name of Medication:   sacubitril-valsartan (ENTRESTO) 49-51 MG    2. How are you currently taking this medication (dosage and times per day)? Take 1 tablet by mouth 2 (two) times daily. - Oral   3. Are you having a reaction (difficulty breathing--STAT)?  4. What is your medication issue? Pt states she spoke to [employer] today and they received patient assistance form however they need a prescription faxed over as well

## 2021-12-25 NOTE — Telephone Encounter (Signed)
Patient returned application for patient assistance with [employer] for [employer]. Provider portion completed and forward to Dr Shirlee More for signature.  Patient also returned a note stating that she is not going to re-enroll with [place] for Eliquis at this time.

## 2021-12-27 ENCOUNTER — Encounter: Payer: Self-pay | Admitting: Cardiology

## 2021-12-27 NOTE — Telephone Encounter (Signed)
Error

## 2021-12-27 NOTE — Telephone Encounter (Signed)
Patient called to follow-up on whether her Gloria Harrington application has been sent.  Patient stated she would like a call back to confirm the fax was sent as [employer] stated they had not received the paperwork as yet.

## 2021-12-30 NOTE — Telephone Encounter (Signed)
Entresto renewal assistance application signed by provider and faxed

## 2021-12-30 NOTE — Telephone Encounter (Signed)
Informed patient that application / prescription was faxed today after provider signed

## 2022-01-07 NOTE — Telephone Encounter (Signed)
Application for Entreso approved through 01/13/23.

## 2022-01-09 ENCOUNTER — Other Ambulatory Visit: Payer: Self-pay | Admitting: Cardiology

## 2022-01-09 NOTE — Telephone Encounter (Signed)
Rx refill sent to pharmacy. 

## 2022-01-28 ENCOUNTER — Other Ambulatory Visit: Payer: Self-pay | Admitting: Cardiology

## 2022-02-17 NOTE — Progress Notes (Unsigned)
Cardiology Office Note:    Date:  02/18/2022   ID:  Gloria Harrington, DOB [birth year], MRN [id number]  PCP:  Mateo Flow, MD  Cardiologist:  Shirlee More, MD    Referring MD: Mateo Flow, MD    ASSESSMENT:    1. Chronic atrial fibrillation ([place])   2. Chronic anticoagulation   3. Hypertensive heart disease with chronic diastolic congestive heart failure ([place])   4. Nonrheumatic mitral valve regurgitation   5. Mixed hyperlipidemia    PLAN:    In order of problems listed above:  [name] continues to do well rate controlled atrial fibrillation continue her beta-blocker and current antibiotic coagulant Failure is compensated EF is normalized with guideline directed therapy she will continue her loop diuretic beta-blocker and Entresto Mitral regurgitation functional improved with improvement of heart failure and ejection fraction Continue her statin Recheck labs today including CMP lipids proBNP level   Next appointment: 6 months   Medication Adjustments/Labs and Tests Ordered: Current medicines are reviewed at length with the patient today.  Concerns regarding medicines are outlined above.  No orders of the defined types were placed in this encounter.  No orders of the defined types were placed in this encounter.   Chief Complaint  Patient presents with   Follow-up   Atrial Fibrillation   Anticoagulation   Hypertension   Congestive Heart Failure    History of Present Illness:    Gloria Harrington is a 77 y.o. female with a hx of chronic atrial fibrillation with rate control and anticoagulation hypertensive heart disease or chronic combined systolic and diastolic heart failure with most recent ejection fraction 55 to 60% and mild mitral regurgitation by echo 02/19/2021 and hyperlipidemia last seen 08/13/2021.  She was seen with Leighton Ruff, CMA, currently as chaperone  Compliance with diet, lifestyle and medications: Yes  Overall she  continues to do well during the day she is quite active [facility] without cardiovascular symptoms not having edema exertional shortness of breath chest pain palpitation or syncope In the early morning she just feels tired as before medications unassociated with low blood pressure or low heart rate She tolerates her anticoagulant without bleeding and her statin without muscle pain or weakness Past Medical History:  Diagnosis Date   Acute idiopathic gout involving toe of right foot 07/12/2019   AF (amaurosis fugax) 08/17/2018   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   Cardiomyopathy, secondary ([place]) 12/24/2016   Carotid atherosclerosis 12/24/2016   Chronic anticoagulation 08/14/2014   Chronic atrial fibrillation ([place]) 08/14/2014   Overview:  CHADS2 vasc score=4   Chronic combined systolic and diastolic heart failure ([place]) 12/24/2016   EF 40% felt to be due to tachycardia   Coronary artery calcification seen on CT scan 08/14/2014   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   Left breast with lumpectomy and planned XRT   Drug-induced hyperkalemia 03/24/2016   Essential hypertension 08/14/2014   Hyperkalemia 03/24/2016   Hyperlipidemia 08/14/2014   Hypertensive heart disease 08/14/2014   Hypothyroid 09/17/2016   Mitral regurgitation 04/21/2017   Osteopenia after menopause 08/24/2020   PAF (paroxysmal atrial fibrillation) ([place]) 08/14/2014   Overview:  CHADS2 vasc score=4   Palpitation 09/17/2016   Primary osteoarthritis of first carpometacarpal joint of right hand 08/28/2017   PUD (peptic ulcer disease) 12/24/2016    Past Surgical History:  Procedure Laterality Date   ARTHROPLASTY     for hammertoe  BREAST BIOPSY     HALLUX VALGUS CORRECTION     open tenotomy of extensor second toe     TUBAL LIGATION      Current Medications: Current Meds  Medication Sig   calcium-vitamin D (OSCAL WITH D) 500-200 MG-UNIT tablet Take 1 tablet by mouth 2 (two) times daily.    dicyclomine (BENTYL) 10 MG capsule Take 10 mg by mouth 3 (three) times daily as needed for pain.   ELIQUIS 5 MG TABS tablet Take 1 tablet (5 mg total) by mouth 2 (two) times daily.   furosemide (LASIX) 20 MG tablet TAKE 1 TABLET BY MOUTH DAILY. TAKE AN EXTRA TABLET IF YOU WEIGH 138 OR GREATER   levothyroxine (SYNTHROID, LEVOTHROID) 75 MCG tablet Take 75 mcg by mouth daily.   metoprolol tartrate (LOPRESSOR) 50 MG tablet Take 1 tablet (50 mg total) by mouth 2 (two) times daily.   Multiple Vitamin (MULTI-VITAMINS) TABS Take 1 tablet by mouth daily.   Multiple Vitamins-Minerals (PRESERVISION AREDS 2 PO) Take 1 tablet by mouth 2 (two) times daily.   Omega-3 Fatty Acids (FISH OIL) 1000 MG CAPS Take 2 capsules by mouth 2 (two) times daily.   pantoprazole (PROTONIX) 40 MG tablet Take 40 mg by mouth daily.   pravastatin (PRAVACHOL) 40 MG tablet Take 1 tablet (40 mg total) by mouth daily.   sacubitril-valsartan (ENTRESTO) 49-51 MG Take 1 tablet by mouth 2 (two) times daily.     Allergies:   Sulfamethoxazole-trimethoprim, Clindamycin, Nitrofurantoin, Penicillins, Prednisone, and Latex   Social History   Socioeconomic History   Marital status: Married    Spouse name: Not on file   Number of children: Not on file   Years of education: Not on file   Highest education level: Not on file  Occupational History   Not on file  Tobacco Use   Smoking status: Never    Passive exposure: Never   Smokeless tobacco: Never  Vaping Use   Vaping Use: Never used  Substance and Sexual Activity   Alcohol use: No   Drug use: No   Sexual activity: Not on file  Other Topics Concern   Not on file  Social History Narrative   Not on file   Social Determinants of Health   Financial Resource Strain: Not on file  Food Insecurity: Not on file  Transportation Needs: Not on file  Physical Activity: Not on file  Stress: Not on file  Social Connections: Not on file     Family History: The patient's family  history includes Breast cancer in her sister and sister; Hyperlipidemia in her sister and sister. ROS:   Please see the history of present illness.    All other systems reviewed and are negative.  EKGs/Labs/Other Studies Reviewed:    The following studies were reviewed today:  EKG:  EKG ordered today and personally reviewed.  The ekg ordered today demonstrates atrial fibrillation controlled ventricular rate R wave progression  Recent Labs: 08/29/2021: ALT 18; B Natriuretic Peptide 287.6; BUN 18; Creatinine 0.9; Hemoglobin 13.7; Platelets 204; Potassium 4.2; Sodium 142; TSH 0.81  Recent Lipid Panel    Component Value Date/Time   CHOL 227 (A) 08/29/2021 0000   CHOL 224 (H) 02/19/2021 1047   TRIG 143 08/29/2021 0000   HDL 51 08/29/2021 0000   HDL 55 02/19/2021 1047   CHOLHDL 4.1 02/19/2021 1047   LDLCALC 147 08/29/2021 0000   LDLCALC 138 (H) 02/19/2021 1047    Physical Exam:    VS:  BP  118/70 (BP Location: Right Arm, Patient Position: Sitting)   Pulse 85   Ht '5\' 5"'$  (1.651 m)   Wt 145 lb (65.8 kg)   LMP  (LMP Unknown)   SpO2 98%   BMI 24.13 kg/m     Wt Readings from Last 3 Encounters:  02/18/22 145 lb (65.8 kg)  08/21/21 144 lb 3.2 oz (65.4 kg)  08/13/21 145 lb 12.8 oz (66.1 kg)     GEN: Appears her age does not look chronically ill well nourished, well developed in no acute distress HEENT: Normal NECK: No JVD; No carotid bruits LYMPHATICS: No lymphadenopathy CARDIAC: Rhythm is irregular S1 is variable grade 1/6 murmur of MR apex R-- RESPIRATORY:  Clear to auscultation without rales, wheezing or rhonchi  ABDOMEN: Soft, non-tender, non-distended MUSCULOSKELETAL:  No edema; No deformity  SKIN: Warm and dry NEUROLOGIC:  Alert and oriented x 3 PSYCHIATRIC:  Normal affect    Signed, Shirlee More, MD  02/18/2022 10:50 AM    [place]

## 2022-02-18 ENCOUNTER — Encounter: Payer: Self-pay | Admitting: Cardiology

## 2022-02-18 ENCOUNTER — Ambulatory Visit: Payer: Medicare Other | Attending: Cardiology | Admitting: Cardiology

## 2022-02-18 VITALS — BP 118/70 | HR 85 | Ht 65.0 in | Wt 145.0 lb

## 2022-02-18 DIAGNOSIS — I5032 Chronic diastolic (congestive) heart failure: Secondary | ICD-10-CM

## 2022-02-18 DIAGNOSIS — I110 Hypertensive heart disease with heart failure: Secondary | ICD-10-CM

## 2022-02-18 DIAGNOSIS — E782 Mixed hyperlipidemia: Secondary | ICD-10-CM | POA: Diagnosis not present

## 2022-02-18 DIAGNOSIS — I480 Paroxysmal atrial fibrillation: Secondary | ICD-10-CM | POA: Diagnosis not present

## 2022-02-18 DIAGNOSIS — Z7901 Long term (current) use of anticoagulants: Secondary | ICD-10-CM

## 2022-02-18 DIAGNOSIS — I340 Nonrheumatic mitral (valve) insufficiency: Secondary | ICD-10-CM | POA: Diagnosis not present

## 2022-02-18 DIAGNOSIS — I482 Chronic atrial fibrillation, unspecified: Secondary | ICD-10-CM | POA: Diagnosis not present

## 2022-02-18 MED ORDER — ELIQUIS 5 MG PO TABS: 5.0000 mg | ORAL_TABLET | Freq: Two times a day (BID) | ORAL | 3 refills | Status: DC

## 2022-02-18 NOTE — Addendum Note (Signed)
Addended by: Edwyna Shell I on: 02/18/2022 12:02 PM   Modules accepted: Orders

## 2022-02-18 NOTE — Addendum Note (Signed)
Addended by: Edwyna Shell I on: 02/18/2022 11:22 AM   Modules accepted: Orders

## 2022-02-18 NOTE — Patient Instructions (Signed)
Medication Instructions:  Your physician recommends that you continue on your current medications as directed. Please refer to the Current Medication list given to you today.  *If you need a refill on your cardiac medications before your next appointment, please call your pharmacy*   Lab Work: Your physician recommends that you return for lab work in:   Labs today: CMP, Lipid, Pro BNP  If you have labs (blood work) drawn today and your tests are completely normal, you will receive your results only by: MyChart Message (if you have MyChart) OR A paper copy in the mail If you have any lab test that is abnormal or we need to change your treatment, we will call you to review the results.   Testing/Procedures: None   Follow-Up: At [facility], you and your health needs are our priority.  As part of our continuing mission to provide you with exceptional heart care, we have created designated Provider Care Teams.  These Care Teams include your primary Cardiologist (physician) and Advanced Practice Providers (APPs -  Physician Assistants and Nurse Practitioners) who all work together to provide you with the care you need, when you need it.  We recommend signing up for the patient portal called "MyChart".  Sign up information is provided on this After Visit Summary.  MyChart is used to connect with patients for Virtual Visits (Telemedicine).  Patients are able to view lab/test results, encounter notes, upcoming appointments, etc.  Non-urgent messages can be sent to your provider as well.   To learn more about what you can do with MyChart, go to [email or web address].    Your next appointment:   6 month(s)  Provider:   Shirlee More, MD    Other Instructions None

## 2022-02-19 LAB — LIPID PANEL
Chol/HDL Ratio: 5.8 ratio — ABNORMAL HIGH (ref 0.0–4.4)
Cholesterol, Total: 278 mg/dL — ABNORMAL HIGH (ref 100–199)
HDL: 48 mg/dL (ref >39)
LDL Chol Calc (NIH): 189 mg/dL — ABNORMAL HIGH (ref 0–99)
Triglycerides: 214 mg/dL — ABNORMAL HIGH (ref 0–149)
VLDL Cholesterol Cal: 41 mg/dL — ABNORMAL HIGH (ref 5–40)

## 2022-02-19 LAB — COMPREHENSIVE METABOLIC PANEL
ALT: 16 IU/L (ref 0–32)
AST: 18 IU/L (ref 0–40)
Albumin/Globulin Ratio: 2 (ref 1.2–2.2)
Albumin: 4.8 g/dL (ref 3.8–4.8)
Alkaline Phosphatase: 65 IU/L (ref 44–121)
BUN/Creatinine Ratio: 21 (ref 12–28)
BUN: 24 mg/dL (ref 8–27)
Bilirubin Total: 0.6 mg/dL (ref 0.0–1.2)
CO2: 23 mmol/L (ref 20–29)
Calcium: 10 mg/dL (ref 8.7–10.3)
Chloride: 102 mmol/L (ref 96–106)
Creatinine, Ser: 1.16 mg/dL — ABNORMAL HIGH (ref 0.57–1.00)
Globulin, Total: 2.4 g/dL (ref 1.5–4.5)
Glucose: 87 mg/dL (ref 70–99)
Potassium: 4.7 mmol/L (ref 3.5–5.2)
Sodium: 142 mmol/L (ref 134–144)
Total Protein: 7.2 g/dL (ref 6.0–8.5)
eGFR: 49 mL/min/{1.73_m2} — ABNORMAL LOW (ref >59)

## 2022-02-19 LAB — PRO B NATRIURETIC PEPTIDE: NT-Pro BNP: 1416 pg/mL — ABNORMAL HIGH (ref 0–738)

## 2022-02-24 ENCOUNTER — Telehealth: Payer: Self-pay | Admitting: Cardiology

## 2022-02-24 ENCOUNTER — Telehealth: Payer: Self-pay

## 2022-02-24 DIAGNOSIS — E782 Mixed hyperlipidemia: Secondary | ICD-10-CM

## 2022-02-24 NOTE — Addendum Note (Signed)
Addended by: Truddie Hidden on: 02/24/2022 05:19 PM   Modules accepted: Orders

## 2022-02-24 NOTE — Telephone Encounter (Signed)
Results reviewed with pt as per Dr. Munley's note.  Pt verbalized understanding and had no additional questions. Routed to PCP  

## 2022-02-24 NOTE — Telephone Encounter (Signed)
  Pt is returning call to get labs result

## 2022-03-13 DIAGNOSIS — H40013 Open angle with borderline findings, low risk, bilateral: Secondary | ICD-10-CM | POA: Diagnosis not present

## 2022-04-08 DIAGNOSIS — E782 Mixed hyperlipidemia: Secondary | ICD-10-CM | POA: Diagnosis not present

## 2022-04-09 LAB — LIPID PANEL
Chol/HDL Ratio: 5.6 ratio — ABNORMAL HIGH (ref 0.0–4.4)
Cholesterol, Total: 262 mg/dL — ABNORMAL HIGH (ref 100–199)
HDL: 47 mg/dL (ref >39)
LDL Chol Calc (NIH): 185 mg/dL — ABNORMAL HIGH (ref 0–99)
Triglycerides: 162 mg/dL — ABNORMAL HIGH (ref 0–149)
VLDL Cholesterol Cal: 30 mg/dL (ref 5–40)

## 2022-04-09 LAB — SPECIMEN STATUS REPORT

## 2022-04-10 ENCOUNTER — Telehealth: Payer: Self-pay | Admitting: Cardiology

## 2022-04-10 ENCOUNTER — Other Ambulatory Visit: Payer: Self-pay

## 2022-04-10 DIAGNOSIS — E782 Mixed hyperlipidemia: Secondary | ICD-10-CM

## 2022-04-10 MED ORDER — EZETIMIBE 10 MG PO TABS: 10.0000 mg | ORAL_TABLET | Freq: Every day | ORAL | 3 refills | Status: DC

## 2022-04-10 NOTE — Telephone Encounter (Signed)
Patient calling back with question about the new medication, on when she should take the medication. Please advise

## 2022-04-10 NOTE — Telephone Encounter (Signed)
Spoke with pt and discussed Zetia and when best to take. Advised to ask the pharmacist for best time to take medication. Pt verbalized understanding and had no additional questions.

## 2022-04-17 ENCOUNTER — Telehealth: Payer: Self-pay | Admitting: Cardiology

## 2022-04-17 NOTE — Telephone Encounter (Signed)
Pt called in upset because she states "I want to let somebody know that Delfino Lovett, RN never mailed my lab results off like I asked." She would like to come pick them up at the office tomorrow if possible. Please advise.

## 2022-04-18 NOTE — Telephone Encounter (Signed)
Patient came by office to pick up copy of lab results.  Lab results printed / ROI signed and scanned to chart.

## 2022-04-18 NOTE — Telephone Encounter (Signed)
Left message for the patient to call back.

## 2022-04-28 ENCOUNTER — Telehealth: Payer: Self-pay | Admitting: Cardiology

## 2022-04-28 NOTE — Telephone Encounter (Signed)
*  STAT* If patient is at the pharmacy, call can be transferred to refill team.   1. Which medications need to be refilled? (please list name of each medication and dose if known) ELIQUIS 5 MG TABS tablet   2. Which pharmacy/location (including street and city if local pharmacy) is medication to be sent to?[id number] - [place], [place] - [address]   3. Do they need a 30 day or 90 day supply? 90 Day Supply  Patient stated they only have 3 days worth of medication left

## 2022-04-29 ENCOUNTER — Other Ambulatory Visit: Payer: Self-pay

## 2022-04-29 DIAGNOSIS — I480 Paroxysmal atrial fibrillation: Secondary | ICD-10-CM

## 2022-04-29 DIAGNOSIS — I482 Chronic atrial fibrillation, unspecified: Secondary | ICD-10-CM

## 2022-04-29 MED ORDER — ELIQUIS 5 MG PO TABS: 5.0000 mg | ORAL_TABLET | Freq: Two times a day (BID) | ORAL | 3 refills | Status: DC

## 2022-04-29 NOTE — Telephone Encounter (Signed)
Called patient and informed her that her Eliquis had been re-filled. Patient was appreciative for the call and had no further questions at this time.

## 2022-05-08 DIAGNOSIS — D3132 Benign neoplasm of left choroid: Secondary | ICD-10-CM | POA: Diagnosis not present

## 2022-05-08 DIAGNOSIS — H43392 Other vitreous opacities, left eye: Secondary | ICD-10-CM | POA: Diagnosis not present

## 2022-05-14 DIAGNOSIS — N6313 Unspecified lump in the right breast, lower outer quadrant: Secondary | ICD-10-CM | POA: Diagnosis not present

## 2022-05-14 DIAGNOSIS — N6311 Unspecified lump in the right breast, upper outer quadrant: Secondary | ICD-10-CM | POA: Diagnosis not present

## 2022-05-14 DIAGNOSIS — D0511 Intraductal carcinoma in situ of right breast: Secondary | ICD-10-CM | POA: Diagnosis not present

## 2022-05-14 DIAGNOSIS — R92321 Mammographic fibroglandular density, right breast: Secondary | ICD-10-CM | POA: Diagnosis not present

## 2022-05-23 NOTE — Progress Notes (Signed)
[facility]  [address] 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  This was found through a screening mammogram.   She was treated with lumpectomy.  Pathology revealed 44 mm, grade 2, ductal carcinoma in situ with apocrine features involving a complex sclerosing lesion with multiple foci of sclerosing adenosis.  No invasive cancer was seen. She had a history of 2 prior benign breast biopsies.  She received adjuvant radiation completed in January.  Bone density scan in December revealed osteopenia with a T-score of the spine of -1.3 which is improved by 2.7%, and the T-score of the femur at -2.0 which represents a 13.3% worsening.  She has multiple other medical comorbidities including atrial fibrillation, heart failure and history of gastric culture.  She  declined chemoprevention.  She underwent testing for hereditary cancer syndromes with the [employer] Hereditary Cancer panel.  This did not reveal any clinically significant mutation, but she did have 4 variants of uncertain significance in the APC, DICER1, TERT, and TSC1 genes.  Bilateral diagnostic mammogram in September 2020 did not reveal any evidence of malignancy.  She has CHF and sees Dr. Dulce Sellar.  She states her disease has been well controlled with Entresto.    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 Patient states that she feels well and has no complaints of pain but is worried about a area of pimple like  bumps under her breast that I believe to be a yeast infection. Patient informed me that she does have congestive heart failure and  Atrial fib. She had a diagnostic right mammogram in November 28, 2021 and they described an asymmetry. Her diagnostic unilateral right mammogram on 05/14/2022 revealed stable probable benign asymmetry and nodule in the right breast measuring 4 mm, down from 5 mm. They now recommend repeat bilateral diagnostic mammogram and right breast ultrasound in 6 months. She will see Dr. Lequita Halt in November, 2024 and he will arrange the imaging. Dr. Welton Flakes checks her blood work.  I will see her back in 1 year for reevaluation. She denies signs of infection such as sore throat, sinus drainage, cough, or urinary symptoms.  She denies fevers or recurrent chills. She denies pain. She denies nausea, vomiting, chest pain, dyspnea or cough. Her appetite is good and her weight has increased 1 pounds over last 3 months .  REVIEW OF SYSTEMS:  Review of Systems  Constitutional: Negative.  Negative for appetite change, chills, diaphoresis, fatigue, fever and unexpected weight change.  HENT:  Negative.  Negative for hearing loss, lump/mass, mouth sores, nosebleeds, sore throat, tinnitus, trouble swallowing and voice change.   Eyes: Negative.  Negative for eye problems and icterus.  Respiratory: Negative.  Negative for chest tightness, cough, hemoptysis, shortness of breath and wheezing.   Cardiovascular: Negative.  Negative for chest pain, leg swelling and palpitations.  Gastrointestinal: Negative.  Negative for abdominal distention, abdominal pain, blood in stool, constipation, diarrhea, nausea, rectal pain and vomiting.  Endocrine: Negative.  Genitourinary: Negative.  Negative for bladder incontinence, difficulty urinating, dyspareunia, dysuria, frequency, hematuria, menstrual problem, nocturia, pelvic pain, vaginal bleeding and vaginal discharge.   Musculoskeletal: Negative.  Negative for  arthralgias, back pain, flank pain, gait problem, myalgias, neck pain and neck stiffness.  Skin: Negative.  Negative for itching, rash and wound.  Neurological: Negative.  Negative for dizziness, extremity weakness, gait problem, headaches, light-headedness, numbness, seizures and speech difficulty.  Hematological: Negative.  Negative for adenopathy. Does not bruise/bleed easily.  Psychiatric/Behavioral: Negative.  Negative for confusion, decreased concentration, depression, sleep disturbance and suicidal ideas. The patient is not nervous/anxious.      VITALS:  Blood pressure 129/81, pulse 79, temperature 97.9 F (36.6 C), temperature source Oral, resp. rate 16, height 5\' 5"  (1.651 m), weight 146 lb 4.8 oz (66.4 kg), SpO2 100 %.  Wt Readings from Last 3 Encounters:  05/27/22 146 lb 4.8 oz (66.4 kg)  02/18/22 145 lb (65.8 kg)  08/21/21 144 lb 3.2 oz (65.4 kg)    Body mass index is 24.35 kg/m.  Performance status (ECOG): 0 - Asymptomatic  PHYSICAL EXAM:  Physical Exam Vitals and nursing note reviewed.  Constitutional:      General: She is not in acute distress.    Appearance: Normal appearance. She is normal weight. She is not ill-appearing, toxic-appearing or diaphoretic.  HENT:     Head: Normocephalic and atraumatic.     Right Ear: Tympanic membrane, ear canal and external ear normal. There is no impacted cerumen.     Left Ear: Tympanic membrane, ear canal and external ear normal. There is no impacted cerumen.     Nose: Nose normal. No congestion or rhinorrhea.     Mouth/Throat:     Mouth: Mucous membranes are moist.     Pharynx: Oropharynx is clear. No oropharyngeal exudate or posterior oropharyngeal erythema.  Eyes:     General: No scleral icterus.       Right eye: No discharge.        Left eye: No discharge.     Extraocular Movements: Extraocular movements intact.     Conjunctiva/sclera: Conjunctivae normal.     Pupils: Pupils are equal, round, and reactive to light.   Neck:     Vascular: No carotid bruit.  Cardiovascular:     Rate and Rhythm: Normal rate and regular rhythm.     Pulses: Normal pulses.     Heart sounds: Normal heart sounds. No murmur heard.    No friction rub. No gallop.     Comments: Irregularly regular, A-fib Pulmonary:     Effort: Pulmonary effort is normal. No respiratory distress.     Breath sounds: Normal breath sounds. No stridor. No wheezing, rhonchi or rales.  Chest:     Chest wall: No tenderness.  Breasts:    Right: Normal.     Left: Normal.     Comments: Breasts are without masses. Scattered cysts in the right breast. Scar in the upper outer quadrant of the right breast.  Well healed scar in the upper left breast. Mild to moderate fibrocystic changes in both breast. A yeast type rash under both breast which is mild Abdominal:     General: Bowel sounds are normal. There is no distension.     Palpations: Abdomen is soft. There is no hepatomegaly, splenomegaly or mass.     Tenderness: There is no abdominal tenderness. There is no right CVA tenderness, left CVA tenderness, guarding or rebound.     Hernia: No hernia is present.  Musculoskeletal:        General: No swelling, tenderness, deformity or signs of injury. Normal range of motion.     Cervical back: Normal range of motion and neck supple. No rigidity or tenderness.     Right lower leg: No edema.     Left lower leg: No edema.  Lymphadenopathy:     Cervical: No cervical adenopathy.  Skin:    General: Skin is warm and dry.     Coloration: Skin is not jaundiced or pale.     Findings: No bruising, erythema, lesion or rash.  Neurological:     General: No focal deficit present.     Mental Status: She is alert and oriented to person, place, and time. Mental status is at baseline.     Cranial Nerves: No cranial nerve deficit.     Sensory: No sensory deficit.     Motor: No weakness.     Coordination: Coordination normal.     Gait: Gait normal.     Deep Tendon  Reflexes: Reflexes normal.  Psychiatric:        Mood and Affect: Mood normal.        Behavior: Behavior normal.        Thought Content: Thought content normal.        Judgment: Judgment normal.    LABS:      Latest Ref Rng & Units 08/29/2021   12:00 AM 09/12/2020   12:00 AM  CBC  WBC  5.7     4.6   Hemoglobin 12.0 - 16.0 13.7     13.0   Hematocrit 36 - 46 39     37   Platelets 150 - 400 K/uL 204     185      This result is from an external source.      Latest Ref Rng & Units 02/18/2022   11:22 AM 08/29/2021   12:00 AM 02/19/2021   10:47 AM  CMP  Glucose 70 - 99 mg/dL 87   540   BUN 8 - 27 mg/dL 24  18     18    Creatinine 0.57 - 1.00 mg/dL 9.81  0.9     1.91   Sodium 134 - 144 mmol/L 142  142     141   Potassium 3.5 - 5.2 mmol/L 4.7  4.2     4.4   Chloride 96 - 106 mmol/L 102  104     104   CO2 20 - 29 mmol/L 23  28     25    Calcium 8.7 - 10.3 mg/dL 47.8  29.5     9.9   Total Protein 6.0 - 8.5 g/dL 7.2   7.1   Total Bilirubin 0.0 - 1.2 mg/dL 0.6   0.8   Alkaline Phos 44 - 121 IU/L 65  53     63   AST 0 - 40 IU/L 18  17     20    ALT 0 - 32 IU/L 16  18     22       This result is from an external source.   Component Ref Range & Units 1 mo ago (04/08/22) 3 mo ago (02/18/22) 1 yr ago (02/19/21)  Cholesterol, Total 100 - 199 mg/dL 621 High  308 High  657 High   Triglycerides 0 - 149 mg/dL 846 High  962 High  952 High   HDL >39 mg/dL 47 48 55  VLDL Cholesterol Cal 5 - 40 mg/dL 30  41 High  31  LDL Chol Calc (NIH) 0 - 99 mg/dL 161 High  096 High  045 High   Chol/HDL Ratio 0.0 - 4.4 ratio 5.6 High  5.8 High  CM 4.1 CM   Component Ref Range & Units 3 mo ago (02/18/22) 2 yr ago (02/14/20) 3 yr ago (02/15/19) 4 yr ago (02/02/18) 4 yr ago (12/29/17)  NT-Pro BNP 0 - 738 pg/mL 1,416 High  1,627 High  R, CM 2,144 High  R, CM 1,158 High  R, CM 1,384 High  R, CM      STUDIES:  No results found.  Exam: 05/14/2022 Digital Diagnostic Unilateral Right Mammogram with Tomosynthesis;  ultrasound right breast Impression:   Stable probable benign asymmetry and nodule in the right breast.     Allergies:  Allergies  Allergen Reactions   Sulfamethoxazole-Trimethoprim Swelling    Tongue swelling   Clindamycin Other (See Comments)    unknown   Nitrofurantoin Other (See Comments)    unknown   Penicillins Itching    On feet   Prednisone Other (See Comments)    Fever and shaking   Latex Rash    Current Medications: Current Outpatient Medications  Medication Sig Dispense Refill   calcium-vitamin D (OSCAL WITH D) 500-200 MG-UNIT tablet Take 1 tablet by mouth 2 (two) times daily.     dicyclomine (BENTYL) 10 MG capsule Take 10 mg by mouth 3 (three) times daily as needed for pain.     ELIQUIS 5 MG TABS tablet Take 1 tablet (5 mg total) by mouth 2 (two) times daily. 180 tablet 3   ezetimibe (ZETIA) 10 MG tablet Take 1 tablet (10 mg total) by mouth daily. 90 tablet 3   furosemide (LASIX) 20 MG tablet TAKE 1 TABLET BY MOUTH DAILY. TAKE AN EXTRA TABLET IF YOU WEIGH 138 OR GREATER 180 tablet 2   levothyroxine (SYNTHROID, LEVOTHROID) 75 MCG tablet Take 75 mcg by mouth daily.  0   metoprolol tartrate (LOPRESSOR) 50 MG tablet Take 1 tablet (50 mg total) by mouth 2 (two) times daily. 180 tablet 2   Multiple Vitamin (MULTI-VITAMINS) TABS Take 1 tablet by mouth daily.     Multiple Vitamins-Minerals (PRESERVISION AREDS 2 PO) Take 1 tablet by mouth 2 (two) times daily.     Omega-3 Fatty Acids (FISH OIL) 1000 MG CAPS Take 2 capsules by mouth 2 (two) times daily.     pantoprazole (PROTONIX) 40 MG tablet Take 40 mg by mouth daily.  12   pravastatin (PRAVACHOL) 40 MG tablet Take 1 tablet (40 mg total) by mouth daily. 90 tablet 1   sacubitril-valsartan (ENTRESTO) 49-51 MG Take 1 tablet by mouth 2 (two) times daily. 180 tablet 3   No current facility-administered medications for this visit.    ASSESSMENT & PLAN:  Assessment:  Gloria Harrington is a 77 y.o. female with ductal  carcinoma in situ of the left breast in October of 2018.  She remains without evidence of disease.     Plan: She had a diagnostic right mammogram in November 28, 2021 and they described an asymmetry. Her diagnostic unilateral right mammogram on 05/14/2022 revealed stable probable benign asymmetry and nodule in the right breast measuring 4 mm, down from 5 mm. They now recommend repeat bilateral diagnostic mammogram and right breast ultrasound in 6 months. She will see Dr. Lequita Halt in November, 2024 and he will arrange the imaging. Dr. Welton Flakes checks her blood work.  I will see her back in 1 year  for reevaluation. The patient understands the plans discussed today and is in agreement with them.  She knows to contact our office if she develops concerns regarding her breast cancer.  I provided 15 minutes of face-to-face time during this this encounter and > 50% was spent counseling as documented under my assessment and plan.    Dellia Beckwith, MD [facility] [address] [place] [address] Dept: [phone] Dept Fax: [phone]   Rulon Sera Lassiter,acting as a scribe for Dellia Beckwith, MD.,have documented all relevant documentation on the behalf of Dellia Beckwith, MD,as directed by  Dellia Beckwith, MD while in the presence of Dellia Beckwith, MD.   I have reviewed this report as typed by the medical scribe, and it is complete and accurate.

## 2022-05-27 ENCOUNTER — Encounter: Payer: Self-pay | Admitting: Oncology

## 2022-05-27 ENCOUNTER — Inpatient Hospital Stay: Payer: Medicare Other | Attending: Oncology | Admitting: Oncology

## 2022-05-27 VITALS — BP 129/81 | HR 79 | Temp 97.9°F | Resp 16 | Ht 65.0 in | Wt 146.3 lb

## 2022-05-27 DIAGNOSIS — Z853 Personal history of malignant neoplasm of breast: Secondary | ICD-10-CM | POA: Diagnosis not present

## 2022-05-27 DIAGNOSIS — D0512 Intraductal carcinoma in situ of left breast: Secondary | ICD-10-CM | POA: Diagnosis not present

## 2022-05-27 DIAGNOSIS — R928 Other abnormal and inconclusive findings on diagnostic imaging of breast: Secondary | ICD-10-CM | POA: Diagnosis not present

## 2022-06-10 ENCOUNTER — Other Ambulatory Visit: Payer: Self-pay | Admitting: Cardiology

## 2022-06-10 DIAGNOSIS — E782 Mixed hyperlipidemia: Secondary | ICD-10-CM | POA: Diagnosis not present

## 2022-06-11 LAB — LIPID PANEL
Chol/HDL Ratio: 4.1 ratio (ref 0.0–4.4)
Cholesterol, Total: 198 mg/dL (ref 100–199)
HDL: 48 mg/dL (ref >39)
LDL Chol Calc (NIH): 123 mg/dL — ABNORMAL HIGH (ref 0–99)
Triglycerides: 151 mg/dL — ABNORMAL HIGH (ref 0–149)
VLDL Cholesterol Cal: 27 mg/dL (ref 5–40)

## 2022-06-16 ENCOUNTER — Other Ambulatory Visit: Payer: Self-pay | Admitting: Cardiology

## 2022-06-16 ENCOUNTER — Telehealth: Payer: Self-pay | Admitting: Cardiology

## 2022-06-16 DIAGNOSIS — I5042 Chronic combined systolic (congestive) and diastolic (congestive) heart failure: Secondary | ICD-10-CM

## 2022-06-16 NOTE — Telephone Encounter (Signed)
Patient is returning call in regards to lab results. Requesting return call.   

## 2022-07-21 ENCOUNTER — Other Ambulatory Visit: Payer: Self-pay | Admitting: Cardiology

## 2022-09-01 DIAGNOSIS — I509 Heart failure, unspecified: Secondary | ICD-10-CM | POA: Diagnosis not present

## 2022-09-01 DIAGNOSIS — Z0000 Encounter for general adult medical examination without abnormal findings: Secondary | ICD-10-CM | POA: Diagnosis not present

## 2022-09-01 DIAGNOSIS — Z79899 Other long term (current) drug therapy: Secondary | ICD-10-CM | POA: Diagnosis not present

## 2022-09-01 DIAGNOSIS — Z9181 History of falling: Secondary | ICD-10-CM | POA: Diagnosis not present

## 2022-09-01 DIAGNOSIS — I10 Essential (primary) hypertension: Secondary | ICD-10-CM | POA: Diagnosis not present

## 2022-09-01 DIAGNOSIS — Z6824 Body mass index (BMI) 24.0-24.9, adult: Secondary | ICD-10-CM | POA: Diagnosis not present

## 2022-09-01 DIAGNOSIS — E782 Mixed hyperlipidemia: Secondary | ICD-10-CM | POA: Diagnosis not present

## 2022-09-01 DIAGNOSIS — E039 Hypothyroidism, unspecified: Secondary | ICD-10-CM | POA: Diagnosis not present

## 2022-09-01 DIAGNOSIS — E790 Hyperuricemia without signs of inflammatory arthritis and tophaceous disease: Secondary | ICD-10-CM | POA: Diagnosis not present

## 2022-09-01 DIAGNOSIS — I4891 Unspecified atrial fibrillation: Secondary | ICD-10-CM | POA: Diagnosis not present

## 2022-09-01 LAB — LAB REPORT - SCANNED: EGFR: 60

## 2022-09-09 ENCOUNTER — Ambulatory Visit: Payer: Medicare Other | Admitting: Cardiology

## 2022-10-01 DIAGNOSIS — H40013 Open angle with borderline findings, low risk, bilateral: Secondary | ICD-10-CM | POA: Diagnosis not present

## 2022-10-01 DIAGNOSIS — H3589 Other specified retinal disorders: Secondary | ICD-10-CM | POA: Diagnosis not present

## 2022-10-07 ENCOUNTER — Other Ambulatory Visit: Payer: Self-pay | Admitting: Cardiology

## 2022-10-07 NOTE — Telephone Encounter (Signed)
Rx refill sent to pharmacy. 

## 2022-10-21 DIAGNOSIS — M5090 Cervical disc disorder, unspecified, unspecified cervical region: Secondary | ICD-10-CM | POA: Diagnosis not present

## 2022-10-21 DIAGNOSIS — M5412 Radiculopathy, cervical region: Secondary | ICD-10-CM | POA: Diagnosis not present

## 2022-10-24 DIAGNOSIS — M542 Cervicalgia: Secondary | ICD-10-CM | POA: Diagnosis not present

## 2022-10-27 DIAGNOSIS — M542 Cervicalgia: Secondary | ICD-10-CM | POA: Diagnosis not present

## 2022-10-31 DIAGNOSIS — M542 Cervicalgia: Secondary | ICD-10-CM | POA: Diagnosis not present

## 2022-11-04 DIAGNOSIS — M542 Cervicalgia: Secondary | ICD-10-CM | POA: Diagnosis not present

## 2022-11-04 NOTE — Progress Notes (Unsigned)
Cardiology Office Note:    Date:  11/05/2022   ID:  Gloria Harrington, DOB 1945-10-18, MRN [id number]  PCP:  Lise Auer, MD  Cardiologist:  Norman Herrlich, MD    Referring MD: Lise Auer, MD    ASSESSMENT:    1. Chronic atrial fibrillation (HCC)   2. Chronic anticoagulation   3. Hypertensive heart disease with chronic diastolic congestive heart failure (HCC)   4. Nonrheumatic mitral valve regurgitation   5. Mixed hyperlipidemia    PLAN:    In order of problems listed above:  Rate is well-controlled continue her beta-blocker and current anticoagulant without bleeding complication Doing well remains asymptomatic she takes as needed loop diuretic along with Entresto and beta-blocker.  Next visit will consider repeat echocardiogram noted her EF had normalized with guideline directed treatment Stable mitral regurgitation is functional not severe Fortunately tolerating continue her lipid-lowering therapy with combined pravastatin and Zetia check lipid profile today   Next appointment: 6 months   Medication Adjustments/Labs and Tests Ordered: Current medicines are reviewed at length with the patient today.  Concerns regarding medicines are outlined above.  No orders of the defined types were placed in this encounter.  No orders of the defined types were placed in this encounter.    History of Present Illness:    Gloria Harrington is a 77 y.o. female with a hx of chronic atrial fibrillation on rate control and anticoagulation hypertensive heart disease with chronic combined systolic and diastolic heart failure most recent ejection fraction 55 to 60% mild mitral regurgitation by echo and hyper lipidemia last seen 02/18/2022. 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   Cardiomyopathy, secondary (HCC) 12/24/2016   Carotid atherosclerosis 12/24/2016   Chronic anticoagulation 08/14/2014   Chronic atrial fibrillation (HCC) 08/14/2014   Overview:  CHADS2 vasc score=4   Chronic combined systolic and diastolic heart failure (HCC) 12/24/2016   EF 40% felt to be due to tachycardia   Coronary artery calcification seen on CT scan 08/14/2014   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   Left breast with lumpectomy and planned XRT   Drug-induced hyperkalemia 03/24/2016   Essential hypertension 08/14/2014   Hyperkalemia 03/24/2016   Hyperlipidemia 08/14/2014   Hypertensive heart disease 08/14/2014   Hypothyroid 09/17/2016   Mitral regurgitation 04/21/2017   Osteopenia after menopause 08/24/2020   PAF (paroxysmal atrial fibrillation) (HCC) 08/14/2014   Overview:  CHADS2 vasc score=4   Palpitation 09/17/2016   Primary osteoarthritis of first carpometacarpal joint of right hand 08/28/2017   PUD (peptic ulcer disease) 12/24/2016    Current Medications: Current Meds  Medication Sig   calcium-vitamin D (OSCAL WITH D) 500-200 MG-UNIT tablet Take 1 tablet by mouth 2 (two) times daily.   dicyclomine (BENTYL) 10 MG capsule Take 10 mg by mouth 3 (three) times daily as needed for pain.   ELIQUIS 5 MG TABS tablet Take 1 tablet (5 mg total) by mouth 2 (two)  times daily.   ezetimibe (ZETIA) 10 MG tablet Take 1 tablet (10 mg total) by mouth daily.   furosemide (LASIX) 20 MG tablet TAKE 1 TABLET BY MOUTH DAILY. TAKE AN EXTRA TABLET IF YOU WEIGH 138 OR GREATER (Patient taking differently: Take 20 mg by mouth daily. TAKE 1 TABLET BY MOUTH  DAILY. TAKE AN EXTRA TABLET IF YOU WEIGH 138 OR GREATER)   levothyroxine (SYNTHROID, LEVOTHROID) 75 MCG tablet Take 75 mcg by mouth daily.   metoprolol tartrate (LOPRESSOR) 50 MG tablet Take 1 tablet (50 mg total) by mouth 2 (two) times daily.   Multiple Vitamin (MULTI-VITAMINS) TABS Take 1 tablet by mouth daily.   Multiple Vitamins-Minerals (PRESERVISION AREDS 2 PO) Take 1 tablet by mouth 2 (two) times daily.   Omega-3 Fatty Acids (FISH OIL) 1000 MG CAPS Take 2 capsules by mouth 2 (two) times daily.   pantoprazole (PROTONIX) 40 MG tablet Take 40 mg by mouth daily.   pravastatin (PRAVACHOL) 40 MG tablet TAKE 1 TABLET(40 MG) BY MOUTH DAILY (Patient taking differently: Take 40 mg by mouth daily.)   sacubitril-valsartan (ENTRESTO) 49-51 MG Take 1 tablet by mouth 2 (two) times daily.      EKGs/Labs/Other Studies Reviewed:    The following studies were reviewed today:  Cardiac Studies & Procedures     STRESS TESTS  MYOCARDIAL PERFUSION IMAGING 02/02/2017  Narrative  The nuclear perfusion study is normal.  Gated images not obtained due to atrial fibrillation and PVCs  There was no ST segment deviation noted during stress. Frequent PVCs were noted including bigeminal PVCs  This is a low risk study.   ECHOCARDIOGRAM  ECHOCARDIOGRAM COMPLETE 08/14/2021  Narrative ECHOCARDIOGRAM REPORT    Patient Name:   Gloria Harrington Date of Exam: 08/14/2021 Medical Rec #:  [id number]                    Height:       65.0 in Accession #:    [id number]                   Weight:       145.8 lb Date of Birth:  11-Sep-1945                    BSA:          1.729 m Patient Age:    75 years                     BP:           122/72 mmHg Patient Gender: F                            HR:           73 bpm. Exam Location:  [facility]  Procedure: 2D Echo, Cardiac Doppler, Color Doppler and Strain Analysis  Indications:    Chronic atrial fibrillation (HCC) [I48.20 (ICD-10-CM)];  Chronic anticoagulation [Z79.01 (ICD-10-CM)]; Hypertensive heart disease with chronic combined systolic and diastolic congestive heart failure (HCC) [I11.0, I50.42 (ICD-10-CM)]; Nonrheumatic mitral valve regurgitation [I34.0 (ICD-10-CM)]; Hyperlipidemia, unspecified hyperlipidemia type [E78.5 (ICD-10-CM)]  History:        Patient has prior history of Echocardiogram examinations, most recent 02/15/2020. CHF, Arrythmias:Atrial Fibrillation; Risk Factors:Hypertension and Dyslipidemia.  Sonographer:    Margreta Journey RDCS Referring Phys: [id number] Zuha Dejonge J Zondra Lawlor  IMPRESSIONS   1. Left ventricular ejection fraction, by estimation, is 60  to 65%. The left ventricle has normal function. The left ventricle has no regional wall motion abnormalities. Left ventricular diastolic parameters are indeterminate. 2. Right ventricular systolic function is normal. The right ventricular size is normal. There is normal pulmonary artery systolic pressure. 3. The mitral valve is normal in structure. Mild mitral valve regurgitation. No evidence of mitral stenosis. 4. The aortic valve is normal in structure. Aortic valve regurgitation is mild. No aortic stenosis is present. 5. The inferior vena cava is normal in size with greater than 50% respiratory variability, suggesting right atrial pressure of 3 mmHg.  FINDINGS Left Ventricle: Left ventricular ejection fraction, by estimation, is 60 to 65%. The left ventricle has normal function. The left ventricle has no regional wall motion abnormalities. The left ventricular internal cavity size was normal in size. There is no left ventricular hypertrophy. Left ventricular diastolic parameters are indeterminate.  Right Ventricle: The right ventricular size is normal. No increase in right ventricular wall thickness. Right ventricular systolic function is normal. There is normal pulmonary artery systolic pressure. The tricuspid regurgitant velocity is 2.49 m/s, and with an  assumed right atrial pressure of 3 mmHg, the estimated right ventricular systolic pressure is 27.8 mmHg.  Left Atrium: Left atrial size was normal in size.  Right Atrium: Right atrial size was normal in size.  Pericardium: There is no evidence of pericardial effusion.  Mitral Valve: The mitral valve is normal in structure. Mild mitral valve regurgitation. No evidence of mitral valve stenosis.  Tricuspid Valve: The tricuspid valve is normal in structure. Tricuspid valve regurgitation is mild . No evidence of tricuspid stenosis.  Aortic Valve: The aortic valve is normal in structure. Aortic valve regurgitation is mild. Aortic regurgitation PHT measures 701 msec. No aortic stenosis is present.  Pulmonic Valve: The pulmonic valve was normal in structure. Pulmonic valve regurgitation is not visualized. No evidence of pulmonic stenosis.  Aorta: The aortic root is normal in size and structure.  Venous: The inferior vena cava is normal in size with greater than 50% respiratory variability, suggesting right atrial pressure of 3 mmHg.  IAS/Shunts: No atrial level shunt detected by color flow Doppler.   LEFT VENTRICLE PLAX 2D LVIDd:         3.70 cm   Diastology LVIDs:         2.60 cm   LV e' medial:    9.25 cm/s LV PW:         0.90 cm   LV E/e' medial:  10.7 LV IVS:        0.90 cm   LV e' lateral:   10.20 cm/s LVOT diam:     1.70 cm   LV E/e' lateral: 9.7 LV SV:         40 LV SV Index:   23 LVOT Area:     2.27 cm   RIGHT VENTRICLE            IVC RV Basal diam:  2.60 cm    IVC diam: 1.40 cm RV S prime:     7.07 cm/s TAPSE (M-mode): 1.2 cm  LEFT ATRIUM             Index        RIGHT ATRIUM           Index LA diam:        4.30 cm 2.49 cm/m   RA Area:     14.10 cm LA Vol (A2C):   40.6 ml 23.48 ml/m  RA  Volume:   29.90 ml  17.29 ml/m LA Vol (A4C):   40.1 ml 23.19 ml/m LA Biplane Vol: 40.6 ml 23.48 ml/m AORTIC VALVE LVOT Vmax:   79.80 cm/s LVOT Vmean:  55.700 cm/s LVOT VTI:     0.178 m AI PHT:      701 msec  AORTA Ao Root diam: 2.70 cm Ao Asc diam:  3.10 cm Ao Desc diam: 1.60 cm  MITRAL VALVE               TRICUSPID VALVE MV Area (PHT): 3.91 cm    TR Peak grad:   24.8 mmHg MV Decel Time: 194 msec    TR Vmax:        249.00 cm/s MR Peak grad: 70.9 mmHg MR Vmax:      421.00 cm/s  SHUNTS MV E velocity: 99.40 cm/s  Systemic VTI:  0.18 m Systemic Diam: 1.70 cm  Gypsy Balsam MD Electronically signed by Gypsy Balsam MD Signature Date/Time: 08/14/2021/4:59:25 PM    Final                 Recent Labs: 02/18/2022: ALT 16; BUN 24; Creatinine, Ser 1.16; NT-Pro BNP 1,416; Potassium 4.7; Sodium 142  Recent Lipid Panel    Component Value Date/Time   CHOL 198 06/10/2022 0906   TRIG 151 (H) 06/10/2022 0906   HDL 48 06/10/2022 0906   CHOLHDL 4.1 06/10/2022 0906   LDLCALC 123 (H) 06/10/2022 0906    Physical Exam:    VS:  BP 130/72 (BP Location: Left Arm, Patient Position: Sitting)   Pulse 84   Ht 5\' 5"  (1.651 m)   Wt 147 lb 3.2 oz (66.8 kg)   LMP  (LMP Unknown)   SpO2 96%   BMI 24.50 kg/m     Wt Readings from Last 3 Encounters:  11/05/22 147 lb 3.2 oz (66.8 kg)  05/27/22 146 lb 4.8 oz (66.4 kg)  02/18/22 145 lb (65.8 kg)     GEN:  Well nourished, well developed in no acute distress HEENT: Normal NECK: No JVD; No carotid bruits LYMPHATICS: No lymphadenopathy CARDIAC: Rhythm is irregular rate is controlled S1 is variable 1/6 murmur of MR apex  RESPIRATORY:  Clear to auscultation without rales, wheezing or rhonchi  ABDOMEN: Soft, non-tender, non-distended MUSCULOSKELETAL:  No edema; No deformity  SKIN: Warm and dry NEUROLOGIC:  Alert and oriented x 3 PSYCHIATRIC:  Normal affect    Signed, Norman Herrlich, MD  11/05/2022 9:37 AM    [facility]

## 2022-11-05 ENCOUNTER — Ambulatory Visit: Payer: Medicare Other | Attending: Cardiology | Admitting: Cardiology

## 2022-11-05 ENCOUNTER — Encounter: Payer: Self-pay | Admitting: Cardiology

## 2022-11-05 VITALS — BP 130/72 | HR 84 | Ht 65.0 in | Wt 147.2 lb

## 2022-11-05 DIAGNOSIS — I5032 Chronic diastolic (congestive) heart failure: Secondary | ICD-10-CM | POA: Diagnosis not present

## 2022-11-05 DIAGNOSIS — I110 Hypertensive heart disease with heart failure: Secondary | ICD-10-CM

## 2022-11-05 DIAGNOSIS — I482 Chronic atrial fibrillation, unspecified: Secondary | ICD-10-CM

## 2022-11-05 DIAGNOSIS — Z23 Encounter for immunization: Secondary | ICD-10-CM | POA: Diagnosis not present

## 2022-11-05 DIAGNOSIS — E782 Mixed hyperlipidemia: Secondary | ICD-10-CM

## 2022-11-05 DIAGNOSIS — Z7901 Long term (current) use of anticoagulants: Secondary | ICD-10-CM | POA: Diagnosis not present

## 2022-11-05 DIAGNOSIS — I340 Nonrheumatic mitral (valve) insufficiency: Secondary | ICD-10-CM

## 2022-11-05 NOTE — Patient Instructions (Signed)
Medication Instructions:  Your physician recommends that you continue on your current medications as directed. Please refer to the Current Medication list given to you today.  *If you need a refill on your cardiac medications before your next appointment, please call your pharmacy*   Lab Work: Your physician recommends that you return for lab work in:   Labs today: CMP, Lipid, Pro BNP  If you have labs (blood work) drawn today and your tests are completely normal, you will receive your results only by: MyChart Message (if you have MyChart) OR A paper copy in the mail If you have any lab test that is abnormal or we need to change your treatment, we will call you to review the results.   Testing/Procedures: None   Follow-Up: At [facility], you and your health needs are our priority.  As part of our continuing mission to provide you with exceptional heart care, we have created designated Provider Care Teams.  These Care Teams include your primary Cardiologist (physician) and Advanced Practice Providers (APPs -  Physician Assistants and Nurse Practitioners) who all work together to provide you with the care you need, when you need it.  We recommend signing up for the patient portal called "MyChart".  Sign up information is provided on this After Visit Summary.  MyChart is used to connect with patients for Virtual Visits (Telemedicine).  Patients are able to view lab/test results, encounter notes, upcoming appointments, etc.  Non-urgent messages can be sent to your provider as well.   To learn more about what you can do with MyChart, go to [email or web address].    Your next appointment:   6 month(s)  Provider:   Brian Munley, MD    Other Instructions None  

## 2022-11-05 NOTE — Addendum Note (Signed)
Addended by: Roxanne Mins I on: 11/05/2022 09:53 AM   Modules accepted: Orders

## 2022-11-06 DIAGNOSIS — M542 Cervicalgia: Secondary | ICD-10-CM | POA: Diagnosis not present

## 2022-11-06 LAB — COMPREHENSIVE METABOLIC PANEL
ALT: 20 [IU]/L (ref 0–32)
AST: 22 [IU]/L (ref 0–40)
Albumin: 4.7 g/dL (ref 3.8–4.8)
Alkaline Phosphatase: 67 [IU]/L (ref 44–121)
BUN/Creatinine Ratio: 23 (ref 12–28)
BUN: 23 mg/dL (ref 8–27)
Bilirubin Total: 0.8 mg/dL (ref 0.0–1.2)
CO2: 23 mmol/L (ref 20–29)
Calcium: 9.5 mg/dL (ref 8.7–10.3)
Chloride: 103 mmol/L (ref 96–106)
Creatinine, Ser: 0.99 mg/dL (ref 0.57–1.00)
Globulin, Total: 2.5 g/dL (ref 1.5–4.5)
Glucose: 103 mg/dL — ABNORMAL HIGH (ref 70–99)
Potassium: 4.6 mmol/L (ref 3.5–5.2)
Sodium: 142 mmol/L (ref 134–144)
Total Protein: 7.2 g/dL (ref 6.0–8.5)
eGFR: 59 mL/min/{1.73_m2} — ABNORMAL LOW (ref >59)

## 2022-11-06 LAB — LIPID PANEL
Chol/HDL Ratio: 4.1 ratio (ref 0.0–4.4)
Cholesterol, Total: 207 mg/dL — ABNORMAL HIGH (ref 100–199)
HDL: 50 mg/dL (ref >39)
LDL Chol Calc (NIH): 132 mg/dL — ABNORMAL HIGH (ref 0–99)
Triglycerides: 142 mg/dL (ref 0–149)
VLDL Cholesterol Cal: 25 mg/dL (ref 5–40)

## 2022-11-06 LAB — PRO B NATRIURETIC PEPTIDE: NT-Pro BNP: 1549 pg/mL — ABNORMAL HIGH (ref 0–738)

## 2022-11-10 DIAGNOSIS — M542 Cervicalgia: Secondary | ICD-10-CM | POA: Diagnosis not present

## 2022-11-12 ENCOUNTER — Telehealth: Payer: Self-pay | Admitting: Cardiology

## 2022-11-12 NOTE — Telephone Encounter (Signed)
Left message for the patient to call back.

## 2022-11-12 NOTE — Telephone Encounter (Signed)
Pt returning call in ref to results. Requesting cb

## 2022-11-13 DIAGNOSIS — M542 Cervicalgia: Secondary | ICD-10-CM | POA: Diagnosis not present

## 2022-11-13 NOTE — Telephone Encounter (Signed)
Spoke with pt regarding normal lab results per Dr. Hulen Shouts note. Pt verbalized understanding and had no further questions.

## 2022-11-13 NOTE — Telephone Encounter (Signed)
Patient is returning phone call. Patient stated she has an appt at 10 AM.

## 2022-11-17 DIAGNOSIS — M542 Cervicalgia: Secondary | ICD-10-CM | POA: Diagnosis not present

## 2022-11-18 DIAGNOSIS — H40013 Open angle with borderline findings, low risk, bilateral: Secondary | ICD-10-CM | POA: Diagnosis not present

## 2022-11-18 DIAGNOSIS — H2513 Age-related nuclear cataract, bilateral: Secondary | ICD-10-CM | POA: Diagnosis not present

## 2022-11-18 DIAGNOSIS — H25813 Combined forms of age-related cataract, bilateral: Secondary | ICD-10-CM | POA: Diagnosis not present

## 2022-11-18 DIAGNOSIS — Z01818 Encounter for other preprocedural examination: Secondary | ICD-10-CM | POA: Diagnosis not present

## 2022-11-19 DIAGNOSIS — R92323 Mammographic fibroglandular density, bilateral breasts: Secondary | ICD-10-CM | POA: Diagnosis not present

## 2022-11-19 DIAGNOSIS — N6315 Unspecified lump in the right breast, overlapping quadrants: Secondary | ICD-10-CM | POA: Diagnosis not present

## 2022-11-19 DIAGNOSIS — D0511 Intraductal carcinoma in situ of right breast: Secondary | ICD-10-CM | POA: Diagnosis not present

## 2022-11-19 DIAGNOSIS — R928 Other abnormal and inconclusive findings on diagnostic imaging of breast: Secondary | ICD-10-CM | POA: Diagnosis not present

## 2022-11-20 DIAGNOSIS — M542 Cervicalgia: Secondary | ICD-10-CM | POA: Diagnosis not present

## 2022-11-20 DIAGNOSIS — R928 Other abnormal and inconclusive findings on diagnostic imaging of breast: Secondary | ICD-10-CM | POA: Diagnosis not present

## 2022-11-20 DIAGNOSIS — N6315 Unspecified lump in the right breast, overlapping quadrants: Secondary | ICD-10-CM | POA: Diagnosis not present

## 2022-11-24 DIAGNOSIS — M542 Cervicalgia: Secondary | ICD-10-CM | POA: Diagnosis not present

## 2022-11-26 DIAGNOSIS — M542 Cervicalgia: Secondary | ICD-10-CM | POA: Diagnosis not present

## 2022-12-02 DIAGNOSIS — Z853 Personal history of malignant neoplasm of breast: Secondary | ICD-10-CM | POA: Diagnosis not present

## 2022-12-02 DIAGNOSIS — R928 Other abnormal and inconclusive findings on diagnostic imaging of breast: Secondary | ICD-10-CM | POA: Diagnosis not present

## 2022-12-03 ENCOUNTER — Telehealth: Payer: Self-pay

## 2022-12-03 DIAGNOSIS — M542 Cervicalgia: Secondary | ICD-10-CM | POA: Diagnosis not present

## 2022-12-03 NOTE — Telephone Encounter (Signed)
Pt is requesting a call to make sure that her paperwork was received and faxed to [employer] last week. Please call the pt and update.

## 2022-12-08 ENCOUNTER — Other Ambulatory Visit: Payer: Self-pay

## 2022-12-09 ENCOUNTER — Other Ambulatory Visit: Payer: Self-pay

## 2022-12-09 DIAGNOSIS — E7800 Pure hypercholesterolemia, unspecified: Secondary | ICD-10-CM | POA: Diagnosis not present

## 2022-12-09 DIAGNOSIS — H25811 Combined forms of age-related cataract, right eye: Secondary | ICD-10-CM | POA: Diagnosis not present

## 2022-12-09 DIAGNOSIS — H353131 Nonexudative age-related macular degeneration, bilateral, early dry stage: Secondary | ICD-10-CM | POA: Diagnosis not present

## 2022-12-09 DIAGNOSIS — E039 Hypothyroidism, unspecified: Secondary | ICD-10-CM | POA: Diagnosis not present

## 2022-12-09 DIAGNOSIS — I482 Chronic atrial fibrillation, unspecified: Secondary | ICD-10-CM | POA: Diagnosis not present

## 2022-12-09 DIAGNOSIS — Z7901 Long term (current) use of anticoagulants: Secondary | ICD-10-CM | POA: Diagnosis not present

## 2022-12-09 DIAGNOSIS — H47233 Glaucomatous optic atrophy, bilateral: Secondary | ICD-10-CM | POA: Diagnosis not present

## 2022-12-09 DIAGNOSIS — I110 Hypertensive heart disease with heart failure: Secondary | ICD-10-CM | POA: Diagnosis not present

## 2022-12-09 DIAGNOSIS — H259 Unspecified age-related cataract: Secondary | ICD-10-CM | POA: Diagnosis not present

## 2022-12-09 DIAGNOSIS — I4892 Unspecified atrial flutter: Secondary | ICD-10-CM | POA: Diagnosis not present

## 2022-12-09 DIAGNOSIS — Z79899 Other long term (current) drug therapy: Secondary | ICD-10-CM | POA: Diagnosis not present

## 2022-12-09 DIAGNOSIS — I509 Heart failure, unspecified: Secondary | ICD-10-CM | POA: Diagnosis not present

## 2022-12-09 NOTE — Telephone Encounter (Signed)
Called patient and informed her that her [employer] Patient Assistance paperwork for her Gloria Harrington had been faxed to [employer] and she could call them to verify that it was received. Patient was appreciative for the call and had no further questions at this time.

## 2022-12-15 DIAGNOSIS — M5090 Cervical disc disorder, unspecified, unspecified cervical region: Secondary | ICD-10-CM | POA: Diagnosis not present

## 2022-12-15 DIAGNOSIS — M5412 Radiculopathy, cervical region: Secondary | ICD-10-CM | POA: Diagnosis not present

## 2023-01-01 ENCOUNTER — Telehealth: Payer: Self-pay | Admitting: Cardiology

## 2023-01-01 NOTE — Telephone Encounter (Signed)
Pt c/o medication issue:  1. Name of Medication:   sacubitril-valsartan (ENTRESTO) 49-51 MG    2. How are you currently taking this medication (dosage and times per day)?   3. Are you having a reaction (difficulty breathing--STAT)?   4. What is your medication issue? Pharmacy has questions about how medication is to be taken

## 2023-01-05 ENCOUNTER — Telehealth: Payer: Self-pay

## 2023-01-05 ENCOUNTER — Other Ambulatory Visit: Payer: Self-pay

## 2023-01-05 NOTE — Telephone Encounter (Signed)
Called the patient's pharmacy and clarified the dose of Entresto she should be taking. Patient's pharmacy had no further questions at this time.

## 2023-01-05 NOTE — Telephone Encounter (Signed)
Received a call from the patient that [facility] needed clarification regarding her patient assistance application for Entresto. Called [facility] and clarified the patient's Entresto frequency. The pharmacist at [facility] was appreciative for the clarification and had no further questions at this time.

## 2023-02-17 ENCOUNTER — Telehealth: Payer: Self-pay | Admitting: Cardiology

## 2023-02-17 ENCOUNTER — Other Ambulatory Visit: Payer: Self-pay

## 2023-02-17 DIAGNOSIS — I482 Chronic atrial fibrillation, unspecified: Secondary | ICD-10-CM

## 2023-02-17 DIAGNOSIS — I480 Paroxysmal atrial fibrillation: Secondary | ICD-10-CM

## 2023-02-17 MED ORDER — ELIQUIS 5 MG PO TABS: 5.0000 mg | ORAL_TABLET | Freq: Two times a day (BID) | ORAL | 3 refills | Status: DC

## 2023-02-17 NOTE — Telephone Encounter (Signed)
*  STAT* If patient is at the pharmacy, call can be transferred to refill team.   1. Which medications need to be refilled? (please list name of each medication and dose if known) ELIQUIS 5 MG TABS tablet    2. Would you like to learn more about the convenience, safety, & potential cost savings by using the [facility]?      3. Are you open to using the [facility] (Type [facility]. ).   4. Which pharmacy/location (including street and city if local pharmacy) is medication to be sent to? [id number] - [place], [place] - [address]    5. Do they need a 30 day or 90 day supply? 90

## 2023-02-17 NOTE — Telephone Encounter (Signed)
Prescription refill request for Eliquis received. Indication:afib Last office visit:10/24 Scr:0.99  10/24 Age: 78 Weight:66.8  kg  Prescription refilled

## 2023-03-18 DIAGNOSIS — H40013 Open angle with borderline findings, low risk, bilateral: Secondary | ICD-10-CM | POA: Diagnosis not present

## 2023-03-18 DIAGNOSIS — H40053 Ocular hypertension, bilateral: Secondary | ICD-10-CM | POA: Diagnosis not present

## 2023-03-23 ENCOUNTER — Other Ambulatory Visit: Payer: Self-pay | Admitting: Cardiology

## 2023-04-02 DIAGNOSIS — M2061 Acquired deformities of toe(s), unspecified, right foot: Secondary | ICD-10-CM | POA: Diagnosis not present

## 2023-04-06 ENCOUNTER — Other Ambulatory Visit: Payer: Self-pay | Admitting: Cardiology

## 2023-04-06 DIAGNOSIS — I5042 Chronic combined systolic (congestive) and diastolic (congestive) heart failure: Secondary | ICD-10-CM

## 2023-04-14 DIAGNOSIS — R051 Acute cough: Secondary | ICD-10-CM | POA: Diagnosis not present

## 2023-05-27 ENCOUNTER — Ambulatory Visit

## 2023-05-27 ENCOUNTER — Inpatient Hospital Stay: Payer: Medicare Other | Admitting: Oncology

## 2023-05-27 ENCOUNTER — Ambulatory Visit: Admitting: Cardiology

## 2023-05-27 VITALS — BP 128/70 | HR 91 | Ht 65.0 in | Wt 149.6 lb

## 2023-05-27 DIAGNOSIS — I482 Chronic atrial fibrillation, unspecified: Secondary | ICD-10-CM

## 2023-05-27 DIAGNOSIS — I340 Nonrheumatic mitral (valve) insufficiency: Secondary | ICD-10-CM | POA: Diagnosis not present

## 2023-05-27 DIAGNOSIS — I5032 Chronic diastolic (congestive) heart failure: Secondary | ICD-10-CM

## 2023-05-27 DIAGNOSIS — I10 Essential (primary) hypertension: Secondary | ICD-10-CM

## 2023-05-27 MED ORDER — FUROSEMIDE 20 MG PO TABS: 20.0000 mg | ORAL_TABLET | ORAL | 3 refills | Status: DC

## 2023-05-27 NOTE — Assessment & Plan Note (Signed)
 Well-controlled. Continue current medications Entresto  and metoprolol  as reviewed below.

## 2023-05-27 NOTE — Assessment & Plan Note (Signed)
 Persistent chronic atrial fibrillation. Rate controlled. Asymptomatic. CHA2DS2-VASc score 4. Continue anticoagulation with Eliquis  5 mg twice daily. Continue rate control with metoprolol  tartrate 50 mg twice daily.

## 2023-05-27 NOTE — Patient Instructions (Signed)
 Medication Instructions:   Take Lasix  20 mg three times a week.  *If you need a refill on your cardiac medications before your next appointment, please call your pharmacy*   Lab Work: None Ordered If you have labs (blood work) drawn today and your tests are completely normal, you will receive your results only by: MyChart Message (if you have MyChart) OR A paper copy in the mail If you have any lab test that is abnormal or we need to change your treatment, we will call you to review the results.   Testing/Procedures: Echocardiogram An echocardiogram is a test that uses sound waves (ultrasound) to produce images of the heart. Images from an echocardiogram can provide important information about: Heart size and shape. The size and thickness and movement of your heart's walls. Heart muscle function and strength. Heart valve function or if you have stenosis. Stenosis is when the heart valves are too narrow. If blood is flowing backward through the heart valves (regurgitation). A tumor or infectious growth around the heart valves. Areas of heart muscle that are not working well because of poor blood flow or injury from a heart attack. Aneurysm detection. An aneurysm is a weak or damaged part of an artery wall. The wall bulges out from the normal force of blood pumping through the body. Tell a health care provider about: Any allergies you have. All medicines you are taking, including vitamins, herbs, eye drops, creams, and over-the-counter medicines. Any blood disorders you have. Any surgeries you have had. Any medical conditions you have. Whether you are pregnant or may be pregnant. What are the risks? Generally, this is a safe test. However, problems may occur, including an allergic reaction to dye (contrast) that may be used during the test. What happens before the test? No specific preparation is needed. You may eat and drink normally. What happens during the test?  You will take  off your clothes from the waist up and put on a hospital gown. Electrodes or electrocardiogram (ECG)patches may be placed on your chest. The electrodes or patches are then connected to a device that monitors your heart rate and rhythm. You will lie down on a table for an ultrasound exam. A gel will be applied to your chest to help sound waves pass through your skin. A handheld device, called a transducer, will be pressed against your chest and moved over your heart. The transducer produces sound waves that travel to your heart and bounce back (or "echo" back) to the transducer. These sound waves will be captured in real-time and changed into images of your heart that can be viewed on a video monitor. The images will be recorded on a computer and reviewed by your health care provider. You may be asked to change positions or hold your breath for a short time. This makes it easier to get different views or better views of your heart. In some cases, you may receive contrast through an IV in one of your veins. This can improve the quality of the pictures from your heart. The procedure may vary among health care providers and hospitals. What can I expect after the test? You may return to your normal, everyday life, including diet, activities, and medicines, unless your health care provider tells you not to do that. Follow these instructions at home: It is up to you to get the results of your test. Ask your health care provider, or the department that is doing the test, when your results will be ready.  Keep all follow-up visits. This is important. Summary An echocardiogram is a test that uses sound waves (ultrasound) to produce images of the heart. Images from an echocardiogram can provide important information about the size and shape of your heart, heart muscle function, heart valve function, and other possible heart problems. You do not need to do anything to prepare before this test. You may eat and  drink normally. After the echocardiogram is completed, you may return to your normal, everyday life, unless your health care provider tells you not to do that. This information is not intended to replace advice given to you by your health care provider. Make sure you discuss any questions you have with your health care provider. Document Revised: 09/12/2020 Document Reviewed: 08/23/2019 Elsevier Patient Education  2023 Elsevier Inc.        Follow-Up: At [facility], you and your health needs are our priority.  As part of our continuing mission to provide you with exceptional heart care, we have created designated Provider Care Teams.  These Care Teams include your primary Cardiologist (physician) and Advanced Practice Providers (APPs -  Physician Assistants and Nurse Practitioners) who all work together to provide you with the care you need, when you need it.  We recommend signing up for the patient portal called "MyChart".  Sign up information is provided on this After Visit Summary.  MyChart is used to connect with patients for Virtual Visits (Telemedicine).  Patients are able to view lab/test results, encounter notes, upcoming appointments, etc.  Non-urgent messages can be sent to your provider as well.   To learn more about what you can do with MyChart, go to [email or web address].    Your next appointment:   6 month follow up with Dr. Sandee Crook

## 2023-05-27 NOTE — Progress Notes (Signed)
 Cardiology Consultation:    Date:  05/27/2023   ID:  Gloria Harrington, DOB [birth year], MRN [id number]  PCP:  Gloria Bower, MD  Cardiologist:  Gloria Evans Rasheda Ledger, MD   Referring MD: Gloria Bower, MD   No chief complaint on file.    ASSESSMENT AND PLAN:   Gloria Harrington 78 year old woman with history of chronic atrial fibrillation on rate control strategy, hypertension, CHF with recovered LVEF [LVEF was 40% by echocardiogram November 2018 at [facility]; ischemic workup with Lexiscan  stress test January 2019 showed no ischemia; follow-up echocardiogram January 2020 showed normalized LVEF 55 to 60%; last echocardiogram to review is from August 2023 with LVEF 60 to 65%], mild mitral regurgitation, hyperlipidemia. Follows up with Dr. Sandee Harrington at our office Seen today for follow-up visit Problem List Items Addressed This Visit     Chronic atrial fibrillation (HCC) - Primary   Persistent chronic atrial fibrillation. Rate controlled. Asymptomatic. CHA2DS2-VASc score 4. Continue anticoagulation with Eliquis  5 mg twice daily. Continue rate control with metoprolol  tartrate 50 mg twice daily.        Relevant Medications   furosemide  (LASIX ) 20 MG tablet   Other Relevant Orders   EKG 12-Lead (Completed)   CHF (congestive heart failure) (HCC)   Nonischemic cardiomyopathy related to tachycardia back in November 2018 EF 40%. Subsequently recovered and normalized EF Last echocardiogram August 2023 LVEF 60 to 65%.  Euvolemic, compensated, NYHA class I.  Continues with salt restriction to below 2 g/day. Continues to monitor her weight daily at home. Continue with furosemide  however reduce the dose to 20 mg 3 times a week rather than daily dosing given her current euvolemic state.  Continue with the guideline directed medical therapy with metoprolol  tartrate 50 mg twice daily Entresto  49 mg / 51 mg twice daily.  With recovered LVEF and no significant heart failure signs or  symptoms at this time we will hold off on SGLT2 inhibitors. Similarly no indication for aldosterone antagonist at this time      Relevant Medications   furosemide  (LASIX ) 20 MG tablet   Mitral regurgitation   Mild mitral regurgitation on prior echocardiogram from August 2023.  Will follow-up with transthoracic echocardiogram for interval assessment.       Relevant Medications   furosemide  (LASIX ) 20 MG tablet   Other Relevant Orders   ECHOCARDIOGRAM COMPLETE   Essential hypertension   Well-controlled. Continue current medications Entresto  and metoprolol  as reviewed below.       Relevant Medications   furosemide  (LASIX ) 20 MG tablet    Return to clinic for follow-up tentatively in 6 months with Dr. Sandee Harrington.   History of Present Illness:    Gloria Harrington is a 78 y.o. female who is being seen today for follow-up visit. PCP is Gloria Bower, MD. Last visit in our office was with Dr. Sandee Harrington 11-05-2022.  Has history of chronic atrial fibrillation on rate control strategy, hypertension, CHF with recovered LVEF [LVEF was 40% by echocardiogram November 2018 at [facility]; ischemic workup with Lexiscan  stress test January 2019 showed no ischemia; follow-up echocardiogram January 2020 showed normalized LVEF 55 to 60%; last echocardiogram to review is from August 2023 with LVEF 60 to 65%], mild mitral regurgitation, hyperlipidemia.  Pleasant woman here for the visit by herself.  Mentions [facility].  No cardiac symptoms.  Doing well with her medications and compliant. Denies anything and weight changes. Denies  any pedal edema. Denies any blood in urine or stools. Has been gradually gaining weight over the past couple years but nothing rapid.  Continues to use furosemide  20 mg once a day.  EKG in the clinic today shows atrial fibrillation heart rate 91/min, QRS duration 70 ms.  Normal axis.  Last blood work  available to review is from November 05, 2022 NT-proBNP remains elevated but relatively stable in comparison to prior numbers at 1549. Lipid panel with total cholesterol 207, triglycerides 142, HDL 50, LDL 132. 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   Cardiomyopathy, secondary (HCC) 12/24/2016   Carotid atherosclerosis 12/24/2016   Chronic anticoagulation 08/14/2014   Chronic atrial fibrillation (HCC) 08/14/2014   Overview:  CHADS2 vasc score=4   Chronic combined systolic and diastolic heart failure (HCC) 12/24/2016   EF 40% felt to be due to tachycardia   Coronary artery calcification seen on CT scan 08/14/2014   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   Left breast with lumpectomy and planned XRT   Drug-induced hyperkalemia 03/24/2016   Essential hypertension 08/14/2014   Hyperkalemia 03/24/2016   Hyperlipidemia 08/14/2014   Hypertensive heart disease 08/14/2014   Hypothyroid 09/17/2016   Mitral regurgitation 04/21/2017   Osteopenia after menopause 08/24/2020   PAF (paroxysmal atrial fibrillation) (HCC) 08/14/2014   Overview:  CHADS2 vasc score=4   Palpitation 09/17/2016   Primary osteoarthritis of first carpometacarpal joint of right hand 08/28/2017   PUD (peptic ulcer disease) 12/24/2016    Past Surgical History:  Procedure Laterality Date   ARTHROPLASTY     for hammertoe   BREAST BIOPSY     HALLUX VALGUS CORRECTION     open tenotomy of extensor second toe     TUBAL LIGATION      Current Medications: Current Meds  Medication Sig   calcium-vitamin D (OSCAL WITH D) 500-200 MG-UNIT tablet Take 1 tablet by mouth 2 (two) times daily.   dicyclomine (BENTYL) 10 MG capsule Take 10 mg by mouth 3 (three) times daily as needed for pain.   ELIQUIS  5 MG TABS tablet Take 1 tablet (5 mg  total) by mouth 2 (two) times daily.   ezetimibe  (ZETIA ) 10 MG tablet TAKE 1 TABLET(10 MG) BY MOUTH DAILY   furosemide  (LASIX ) 20 MG tablet Take 1 tablet (20 mg total) by mouth 3 (three) times a week.   levothyroxine (SYNTHROID, LEVOTHROID) 75 MCG tablet Take 75 mcg by mouth daily.   metoprolol  tartrate (LOPRESSOR ) 50 MG tablet Take 1 tablet (50 mg total) by mouth 2 (two) times daily.   Multiple Vitamin (MULTI-VITAMINS) TABS Take 1 tablet by mouth daily.   Multiple Vitamins-Minerals (PRESERVISION AREDS 2 PO) Take 1 tablet by mouth 2 (two) times daily.   Omega-3 Fatty Acids (FISH OIL) 1000 MG CAPS Take 2 capsules by mouth 2 (two) times daily.   pantoprazole (PROTONIX) 40 MG tablet Take 40 mg by mouth daily.   pravastatin  (PRAVACHOL ) 40 MG tablet TAKE 1 TABLET(40 MG) BY MOUTH DAILY   sacubitril -valsartan  (ENTRESTO ) 49-51 MG Take 1 tablet by mouth 2 (two) times daily.   [DISCONTINUED] furosemide  (LASIX ) 20 MG tablet TAKE 1 TABLET BY MOUTH DAILY. TAKE AN EXTRA TABLET IF YOU WEIGH 138 OR GREATER     Allergies:   Sulfamethoxazole-trimethoprim, Clindamycin,  Nitrofurantoin, Penicillins, Prednisone, and Latex   Social History   Socioeconomic History   Marital status: Married    Spouse name: Not on file   Number of children: Not on file   Years of education: Not on file   Highest education level: Not on file  Occupational History   Not on file  Tobacco Use   Smoking status: Never    Passive exposure: Never   Smokeless tobacco: Never  Vaping Use   Vaping status: Never Used  Substance and Sexual Activity   Alcohol use: No   Drug use: No   Sexual activity: Not on file  Other Topics Concern   Not on file  Social History Narrative   Not on file   Social Drivers of Health   Financial Resource Strain: Not on file  Food Insecurity: Not on file  Transportation Needs: Not on file  Physical Activity: Not on file  Stress: Not on file  Social Connections: Not on file     Family  History: The patient's family history includes Breast cancer in her sister and sister; Hyperlipidemia in her sister and sister. ROS:   Please see the history of present illness.    All 14 point review of systems negative except as described per history of present illness.  EKGs/Labs/Other Studies Reviewed:    The following studies were reviewed today:   EKG:  EKG Interpretation Date/Time:  Wednesday May 27 2023 09:15:53 EDT Ventricular Rate:  91 PR Interval:    QRS Duration:  70 QT Interval:  338 QTC Calculation: 415 R Axis:   65  Text Interpretation: Atrial fibrillation Cannot rule out Anterior infarct , age undetermined Abnormal ECG No previous ECGs available Confirmed by Bertha Broad reddy [phone]) on 05/27/2023 9:43:43 AM    Recent Labs: 11/05/2022: ALT 20; BUN 23; Creatinine, Ser 0.99; NT-Pro BNP 1,549; Potassium 4.6; Sodium 142  Recent Lipid Panel    Component Value Date/Time   CHOL 207 (H) 11/05/2022 1000   TRIG 142 11/05/2022 1000   HDL 50 11/05/2022 1000   CHOLHDL 4.1 11/05/2022 1000   LDLCALC 132 (H) 11/05/2022 1000    Physical Exam:    VS:  BP 128/70   Pulse 91   Ht 5\' 5"  (1.651 m)   Wt 149 lb 9.6 oz (67.9 kg)   LMP  (LMP Unknown)   SpO2 98%   BMI 24.89 kg/m     Wt Readings from Last 3 Encounters:  05/27/23 149 lb 9.6 oz (67.9 kg)  11/05/22 147 lb 3.2 oz (66.8 kg)  05/27/22 146 lb 4.8 oz (66.4 kg)     GENERAL:  Well nourished, well developed in no acute distress NECK: No JVD; No carotid bruits CARDIAC: Irregularly irregular, S1 and S2 present, no murmurs, no rubs, no gallops CHEST:  Clear to auscultation without rales, wheezing or rhonchi  Extremities: No pitting pedal edema. Pulses bilaterally symmetric with radial 2+ and dorsalis pedis 2+ NEUROLOGIC:  Alert and oriented x 3  Medication Adjustments/Labs and Tests Ordered: Current medicines are reviewed at length with the patient today.  Concerns regarding medicines are outlined above.   Orders Placed This Encounter  Procedures   EKG 12-Lead   ECHOCARDIOGRAM COMPLETE   Meds ordered this encounter  Medications   furosemide  (LASIX ) 20 MG tablet    Sig: Take 1 tablet (20 mg total) by mouth 3 (three) times a week.    Dispense:  40 tablet    Refill:  3    Signed,  Tarika Mckethan reddy Exavior Kimmons, MD, MPH, [facility]. 05/27/2023 10:05 AM    [facility]

## 2023-05-27 NOTE — Assessment & Plan Note (Signed)
 Mild mitral regurgitation on prior echocardiogram from August 2023.  Will follow-up with transthoracic echocardiogram for interval assessment.

## 2023-05-27 NOTE — Assessment & Plan Note (Signed)
 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 Subsequently recovered and normalized EF Last echocardiogram August 2023 LVEF 60 to 65%.  Euvolemic, compensated, NYHA class I.  Continues with salt restriction to below 2 g/day. Continues to monitor her weight daily at home. Continue with furosemide  however reduce the dose to 20 mg 3 times a week rather than daily dosing given her current euvolemic state.  Continue with the guideline directed medical therapy with metoprolol  tartrate 50 mg twice daily Entresto  49 mg / 51 mg twice daily.  With recovered LVEF and no significant heart failure signs or symptoms at this time we will hold off on SGLT2 inhibitors. Similarly no indication for aldosterone antagonist at this time

## 2023-06-04 DIAGNOSIS — M79674 Pain in right toe(s): Secondary | ICD-10-CM | POA: Diagnosis not present

## 2023-06-04 DIAGNOSIS — L84 Corns and callosities: Secondary | ICD-10-CM | POA: Diagnosis not present

## 2023-06-04 DIAGNOSIS — M2060 Acquired deformities of toe(s), unspecified, unspecified foot: Secondary | ICD-10-CM | POA: Diagnosis not present

## 2023-06-04 DIAGNOSIS — M2041 Other hammer toe(s) (acquired), right foot: Secondary | ICD-10-CM | POA: Diagnosis not present

## 2023-06-23 ENCOUNTER — Ambulatory Visit

## 2023-06-23 DIAGNOSIS — I340 Nonrheumatic mitral (valve) insufficiency: Secondary | ICD-10-CM

## 2023-06-23 LAB — ECHOCARDIOGRAM COMPLETE
AR max vel: 1.94 cm2
AV Area VTI: 2.02 cm2
AV Area mean vel: 2.1 cm2
AV Mean grad: 2.8 mmHg
AV Peak grad: 6.4 mmHg
AV Vena cont: 0.3 cm
Ao pk vel: 1.27 m/s
Area-P 1/2: 7.66 cm2
MV M vel: 4.07 m/s
MV Peak grad: 66.3 mmHg
P 1/2 time: 535 ms
Radius: 0.4 cm
S' Lateral: 2.5 cm

## 2023-06-25 DIAGNOSIS — H40013 Open angle with borderline findings, low risk, bilateral: Secondary | ICD-10-CM | POA: Diagnosis not present

## 2023-06-25 DIAGNOSIS — H40053 Ocular hypertension, bilateral: Secondary | ICD-10-CM | POA: Diagnosis not present

## 2023-07-01 ENCOUNTER — Telehealth: Payer: Self-pay | Admitting: Cardiology

## 2023-07-01 NOTE — Telephone Encounter (Signed)
 Patient called to follow-up on echocardiogram test results.

## 2023-07-01 NOTE — Telephone Encounter (Signed)
 Advised that results have not been reviewed at this time. Pt verbalized understanding and had no additional questions.

## 2023-07-03 ENCOUNTER — Ambulatory Visit: Payer: Self-pay

## 2023-07-06 NOTE — Telephone Encounter (Signed)
 Attempted to call the patient. A lady answered the phone and asked who I was calling. I explained that I was calling Gloria Harrington and she stated that I could not talk to her and hung up the phone.

## 2023-07-20 DIAGNOSIS — S93104S Unspecified dislocation of right toe(s), sequela: Secondary | ICD-10-CM | POA: Diagnosis not present

## 2023-07-20 DIAGNOSIS — M2040 Other hammer toe(s) (acquired), unspecified foot: Secondary | ICD-10-CM | POA: Diagnosis not present

## 2023-07-20 DIAGNOSIS — M79674 Pain in right toe(s): Secondary | ICD-10-CM | POA: Diagnosis not present

## 2023-07-20 DIAGNOSIS — M898X9 Other specified disorders of bone, unspecified site: Secondary | ICD-10-CM | POA: Diagnosis not present

## 2023-07-20 DIAGNOSIS — M2061 Acquired deformities of toe(s), unspecified, right foot: Secondary | ICD-10-CM | POA: Diagnosis not present

## 2023-07-23 ENCOUNTER — Telehealth: Payer: Self-pay

## 2023-07-23 NOTE — Telephone Encounter (Signed)
   Pre-operative Risk Assessment    Patient Name: Gloria Harrington  DOB: May 25, 1945 MRN: [id number]   Date of last office visit: 05/27/23 Date of next office visit: N/A   Request for Surgical Clearance    Procedure:  right foot hammer toe repair   Date of Surgery:  Clearance TBD                                Surgeon:  Jeb Broach Surgeon's Group or Practice Name:  [facility] Phone number:  [phone] Fax number:  [phone]   Type of Clearance Requested:   - Medical    Type of Anesthesia:  General    Additional requests/questions:    SignedAnnabella LITTIE Sayres   07/23/2023, 4:31 PM

## 2023-07-24 NOTE — Telephone Encounter (Signed)
 Patient with diagnosis of afib on Eliquis  for anticoagulation.    Procedure: right foot hammer toe repair  Date of procedure: TBD   CHA2DS2-VASc Score = 5   This indicates a 7.2% annual risk of stroke. The patient's score is based upon: CHF History: 1 HTN History: 1 Diabetes History: 0 Stroke History: 0 Vascular Disease History: 0 Age Score: 2 Gender Score: 1      CrCl 51 ml/min  Patient has not had an Afib/aflutter ablation within the last 3 months or DCCV within the last 30 days  Per office protocol, patient can hold Eliquis  for 2 days prior to procedure.    **This guidance is not considered finalized until pre-operative APP has relayed final recommendations.**

## 2023-07-24 NOTE — Telephone Encounter (Signed)
Clinical pharmacist to review Eliquis 

## 2023-07-24 NOTE — Telephone Encounter (Signed)
 Dr. Liborio to review this case, patient was seen over a month ago in the office.  She was doing well.  Subsequent echocardiogram showed normal EF, mild MR, mild to moderate AI.  Dr. Chevis, since the patient was just seen and was doing well, would you be ok with her proceeding with low risk surgery?

## 2023-07-27 NOTE — Telephone Encounter (Signed)
   Patient Name: Gloria Harrington  DOB: February 04, 1945 MRN: [id number]  Primary Cardiologist: None  Chart reviewed as part of pre-operative protocol coverage. Patient was recently seen in clinic by Dr. Liborio. Per Dr. Liborio, Patient essentially seen in the office by me in May 2025. Excellent functional status and recovered nonischemic cardiomyopathy with NYHA functional status 1. From cardiac standpoint okay to proceed with her elective foot surgery as being planned.  Given past medical history and time since last visit, based on ACC/AHA guidelines, Gloria Harrington is at acceptable risk for the planned procedure without further cardiovascular testing.   Per office protocol, patient can hold Eliquis  for 2 days prior to procedure. Please resume when safe to do so from a bleeding standpoint.   I will route this recommendation to the requesting party via Epic fax function and remove from pre-op pool.  Please call with questions.  Kemond Amorin D Jocelyn Lowery, NP 07/27/2023, 12:37 PM

## 2023-07-29 NOTE — Telephone Encounter (Signed)
 Patient calling to say that no one called her to go over her echo results. Explain that we did attempt to call on 6/23 per notes. She asked that when we do call back and don't get an answer its okay to leave the echo results on the answering machine. Please advise

## 2023-07-30 NOTE — Telephone Encounter (Signed)
 Results reviewed with pt as per Dr. Madireddy's note.  Pt verbalized understanding and had no additional questions. Routed to PCP

## 2023-08-13 ENCOUNTER — Other Ambulatory Visit: Payer: Self-pay

## 2023-08-13 DIAGNOSIS — H40053 Ocular hypertension, bilateral: Secondary | ICD-10-CM | POA: Diagnosis not present

## 2023-08-13 DIAGNOSIS — I340 Nonrheumatic mitral (valve) insufficiency: Secondary | ICD-10-CM

## 2023-09-02 DIAGNOSIS — R928 Other abnormal and inconclusive findings on diagnostic imaging of breast: Secondary | ICD-10-CM | POA: Diagnosis not present

## 2023-09-02 DIAGNOSIS — Z853 Personal history of malignant neoplasm of breast: Secondary | ICD-10-CM | POA: Diagnosis not present

## 2023-09-02 NOTE — Progress Notes (Incomplete)
 [facility]  [address] 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  This was found through a screening mammogram.   She was treated with lumpectomy.  Pathology revealed 44 mm, grade 2, ductal carcinoma in situ with apocrine features involving a complex sclerosing lesion with multiple foci of sclerosing adenosis.  No invasive cancer was seen. She had a history of 2 prior benign breast biopsies.  She received adjuvant radiation completed in January.  Bone density scan in December revealed osteopenia with a T-score of the spine of -1.3 which is improved by 2.7%, and the T-score of the femur at -2.0 which represents a 13.3% worsening.  She has multiple other medical comorbidities including atrial fibrillation, heart failure and history of gastric culture.  She  declined chemoprevention.  She underwent testing for hereditary cancer syndromes with the [employer] Hereditary Cancer panel.  This did not reveal any clinically significant mutation, but she did have 4 variants of uncertain significance in the APC, DICER1, TERT, and TSC1 genes.  Bilateral diagnostic mammogram in September 2020 did not reveal any evidence of malignancy.  She has CHF and sees Dr. Monetta.  She states her disease has been well controlled with Entresto .    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 Patient states that she feels *** and ***.       She denies fever, chills, night sweats, or other signs of infection. She  denies cardiorespiratory and gastrointestinal issues. She  denies pain. Her appetite is *** and Her weight {Weight change:10426}.  Patient states that she feels well and has no complaints of pain but is worried about a area of pimple like bumps under her breast that I believe to be a yeast infection. Patient informed me that she does have congestive heart failure and  Atrial fib. She had a diagnostic right mammogram in November 28, 2021 and they described an asymmetry. Her diagnostic unilateral right mammogram on 05/14/2022 revealed stable probable benign asymmetry and nodule in the right breast measuring 4 mm, down from 5 mm. They now recommend repeat bilateral diagnostic mammogram and right breast ultrasound in 6 months. She will see Dr. Joesph in November, 2024 and he will arrange the imaging. Dr. Fernand checks her blood work.  I will see her back in 1 year for reevaluation. She denies signs of infection such as sore throat, sinus drainage, cough, or urinary symptoms.  She denies fevers or recurrent chills. She denies pain. She denies nausea, vomiting, chest pain, dyspnea or cough. Her appetite is good and her weight has increased 1 pounds over last 3 months.  REVIEW OF SYSTEMS:  Review of Systems  Constitutional: Negative.  Negative for appetite change, chills, diaphoresis, fatigue, fever and unexpected weight change.  HENT:  Negative.  Negative for hearing loss, lump/mass, mouth sores, nosebleeds, sore throat, tinnitus, trouble swallowing and voice change.   Eyes: Negative.  Negative for eye problems and icterus.  Respiratory: Negative.  Negative for chest tightness,  cough, hemoptysis, shortness of breath and wheezing.   Cardiovascular: Negative.  Negative for chest pain, leg swelling and palpitations.  Gastrointestinal: Negative.  Negative for abdominal distention, abdominal pain, blood in stool, constipation, diarrhea, nausea, rectal pain and vomiting.  Endocrine: Negative.   Genitourinary:  Negative.  Negative for bladder incontinence, difficulty urinating, dyspareunia, dysuria, frequency, hematuria, menstrual problem, nocturia, pelvic pain, vaginal bleeding and vaginal discharge.   Musculoskeletal: Negative.  Negative for arthralgias, back pain, flank pain, gait problem, myalgias, neck pain and neck stiffness.  Skin: Negative.  Negative for itching, rash and wound.  Neurological: Negative.  Negative for dizziness, extremity weakness, gait problem, headaches, light-headedness, numbness, seizures and speech difficulty.  Hematological: Negative.  Negative for adenopathy. Does not bruise/bleed easily.  Psychiatric/Behavioral: Negative.  Negative for confusion, decreased concentration, depression, sleep disturbance and suicidal ideas. The patient is not nervous/anxious.      VITALS:  There were no vitals taken for this visit.  Wt Readings from Last 3 Encounters:  05/27/23 149 lb 9.6 oz (67.9 kg)  11/05/22 147 lb 3.2 oz (66.8 kg)  05/27/22 146 lb 4.8 oz (66.4 kg)    There is no height or weight on file to calculate BMI.  Performance status (ECOG): 0 - Asymptomatic  PHYSICAL EXAM:  Physical Exam Vitals and nursing note reviewed.  Constitutional:      General: She is not in acute distress.    Appearance: Normal appearance. She is normal weight. She is not ill-appearing, toxic-appearing or diaphoretic.  HENT:     Head: Normocephalic and atraumatic.     Right Ear: Tympanic membrane, ear canal and external ear normal. There is no impacted cerumen.     Left Ear: Tympanic membrane, ear canal and external ear normal. There is no impacted cerumen.     Nose: Nose normal. No congestion or rhinorrhea.     Mouth/Throat:     Mouth: Mucous membranes are moist.     Pharynx: Oropharynx is clear. No oropharyngeal exudate or posterior oropharyngeal erythema.  Eyes:     General: No scleral icterus.       Right eye: No discharge.        Left eye: No discharge.     Extraocular Movements:  Extraocular movements intact.     Conjunctiva/sclera: Conjunctivae normal.     Pupils: Pupils are equal, round, and reactive to light.  Neck:     Vascular: No carotid bruit.  Cardiovascular:     Rate and Rhythm: Normal rate and regular rhythm.     Pulses: Normal pulses.     Heart sounds: Normal heart sounds. No murmur heard.    No friction rub. No gallop.     Comments: Irregularly regular, A-fib Pulmonary:     Effort: Pulmonary effort is normal. No respiratory distress.     Breath sounds: Normal breath sounds. No stridor. No wheezing, rhonchi or rales.  Chest:     Chest wall: No tenderness.  Breasts:    Right: Normal.     Left: Normal.     Comments: Breasts are without masses. Scattered cysts in the right breast. Scar in the upper outer quadrant of the right breast.  Well healed scar in the upper left breast. Mild to moderate fibrocystic changes in both breast. A yeast type rash under both breast which is mild Abdominal:     General: Bowel sounds are normal. There is no distension.     Palpations: Abdomen is soft. There is no hepatomegaly, splenomegaly or mass.  Tenderness: There is no abdominal tenderness. There is no right CVA tenderness, left CVA tenderness, guarding or rebound.     Hernia: No hernia is present.  Musculoskeletal:        General: No swelling, tenderness, deformity or signs of injury. Normal range of motion.     Cervical back: Normal range of motion and neck supple. No rigidity or tenderness.     Right lower leg: No edema.     Left lower leg: No edema.  Lymphadenopathy:     Cervical: No cervical adenopathy.  Skin:    General: Skin is warm and dry.     Coloration: Skin is not jaundiced or pale.     Findings: No bruising, erythema, lesion or rash.  Neurological:     General: No focal deficit present.     Mental Status: She is alert and oriented to person, place, and time. Mental status is at baseline.     Cranial Nerves: No cranial nerve deficit.      Sensory: No sensory deficit.     Motor: No weakness.     Coordination: Coordination normal.     Gait: Gait normal.     Deep Tendon Reflexes: Reflexes normal.  Psychiatric:        Mood and Affect: Mood normal.        Behavior: Behavior normal.        Thought Content: Thought content normal.        Judgment: Judgment normal.    LABS:      Latest Ref Rng & Units 08/29/2021   12:00 AM 09/12/2020   12:00 AM  CBC  WBC  5.7     4.6   Hemoglobin 12.0 - 16.0 13.7     13.0   Hematocrit 36 - 46 39     37   Platelets 150 - 400 K/uL 204     185      This result is from an external source.      Latest Ref Rng & Units 11/05/2022   10:00 AM 02/18/2022   11:22 AM 08/29/2021   12:00 AM  CMP  Glucose 70 - 99 mg/dL 896  87    BUN 8 - 27 mg/dL 23  24  18       Creatinine 0.57 - 1.00 mg/dL 9.00  8.83  0.9      Sodium 134 - 144 mmol/L 142  142  142      Potassium 3.5 - 5.2 mmol/L 4.6  4.7  4.2      Chloride 96 - 106 mmol/L 103  102  104      CO2 20 - 29 mmol/L 23  23  28       Calcium 8.7 - 10.3 mg/dL 9.5  89.9  89.8      Total Protein 6.0 - 8.5 g/dL 7.2  7.2    Total Bilirubin 0.0 - 1.2 mg/dL 0.8  0.6    Alkaline Phos 44 - 121 IU/L 67  65  53      AST 0 - 40 IU/L 22  18  17       ALT 0 - 32 IU/L 20  16  18          This result is from an external source.   STUDIES:  No results found.       Allergies:  Allergies  Allergen Reactions   Sulfamethoxazole-Trimethoprim Swelling    Tongue swelling   Clindamycin Other (See Comments)    unknown  Nitrofurantoin Other (See Comments)    unknown   Penicillins Itching    On feet   Prednisone Other (See Comments)    Fever and shaking   Latex Rash    Current Medications: Current Outpatient Medications  Medication Sig Dispense Refill   calcium-vitamin D (OSCAL WITH D) 500-200 MG-UNIT tablet Take 1 tablet by mouth 2 (two) times daily.     dicyclomine (BENTYL) 10 MG capsule Take 10 mg by mouth 3 (three) times daily as needed for pain.      ELIQUIS  5 MG TABS tablet Take 1 tablet (5 mg total) by mouth 2 (two) times daily. 180 tablet 3   ezetimibe  (ZETIA ) 10 MG tablet TAKE 1 TABLET(10 MG) BY MOUTH DAILY 90 tablet 3   furosemide  (LASIX ) 20 MG tablet Take 1 tablet (20 mg total) by mouth 3 (three) times a week. 40 tablet 3   levothyroxine (SYNTHROID, LEVOTHROID) 75 MCG tablet Take 75 mcg by mouth daily.  0   metoprolol  tartrate (LOPRESSOR ) 50 MG tablet Take 1 tablet (50 mg total) by mouth 2 (two) times daily. 180 tablet 1   Multiple Vitamin (MULTI-VITAMINS) TABS Take 1 tablet by mouth daily.     Multiple Vitamins-Minerals (PRESERVISION AREDS 2 PO) Take 1 tablet by mouth 2 (two) times daily.     Omega-3 Fatty Acids (FISH OIL) 1000 MG CAPS Take 2 capsules by mouth 2 (two) times daily.     pantoprazole (PROTONIX) 40 MG tablet Take 40 mg by mouth daily.  12   pravastatin  (PRAVACHOL ) 40 MG tablet TAKE 1 TABLET(40 MG) BY MOUTH DAILY 90 tablet 1   sacubitril -valsartan  (ENTRESTO ) 49-51 MG Take 1 tablet by mouth 2 (two) times daily. 180 tablet 3   No current facility-administered medications for this visit.    ASSESSMENT & PLAN:  Assessment:  Gloria Harrington is a 78 y.o. female with ductal carcinoma in situ of the left breast in October of 2018.  She remains without evidence of disease.     Plan: She had a diagnostic right mammogram in November 28, 2021 and they described an asymmetry. Her diagnostic unilateral right mammogram on 05/14/2022 revealed stable probable benign asymmetry and nodule in the right breast measuring 4 mm, down from 5 mm. They now recommend repeat bilateral diagnostic mammogram and right breast ultrasound in 6 months. She will see Dr. Joesph in November, 2024 and he will arrange the imaging. Dr. Fernand checks her blood work.  I will see her back in 1 year for reevaluation. The patient understands the plans discussed today and is in agreement with them.  She knows to contact our office if she develops concerns  regarding her breast cancer.  I provided 15 minutes of face-to-face time during this this encounter and > 50% was spent counseling as documented under my assessment and plan.   Gloria VEAR Cornish, MD   [facility]PT OF [facility] [address] [place] [address] Dept: [phone] Dept Fax: [phone]   I,Gloria Harrington,acting as a scribe for Gloria VEAR Cornish, MD.,have documented all relevant documentation on the behalf of Gloria VEAR Cornish, MD,as directed by  Gloria VEAR Cornish, MD while in the presence of Gloria VEAR Cornish, MD.   I have reviewed this report as typed by the medical scribe, and it is complete and accurate.

## 2023-09-03 DIAGNOSIS — E782 Mixed hyperlipidemia: Secondary | ICD-10-CM | POA: Diagnosis not present

## 2023-09-03 DIAGNOSIS — M8589 Other specified disorders of bone density and structure, multiple sites: Secondary | ICD-10-CM | POA: Diagnosis not present

## 2023-09-03 DIAGNOSIS — I509 Heart failure, unspecified: Secondary | ICD-10-CM | POA: Diagnosis not present

## 2023-09-03 DIAGNOSIS — Z79899 Other long term (current) drug therapy: Secondary | ICD-10-CM | POA: Diagnosis not present

## 2023-09-03 DIAGNOSIS — I4891 Unspecified atrial fibrillation: Secondary | ICD-10-CM | POA: Diagnosis not present

## 2023-09-03 DIAGNOSIS — Z9181 History of falling: Secondary | ICD-10-CM | POA: Diagnosis not present

## 2023-09-03 DIAGNOSIS — K279 Peptic ulcer, site unspecified, unspecified as acute or chronic, without hemorrhage or perforation: Secondary | ICD-10-CM | POA: Diagnosis not present

## 2023-09-03 DIAGNOSIS — Z0000 Encounter for general adult medical examination without abnormal findings: Secondary | ICD-10-CM | POA: Diagnosis not present

## 2023-09-03 DIAGNOSIS — M109 Gout, unspecified: Secondary | ICD-10-CM | POA: Diagnosis not present

## 2023-09-03 DIAGNOSIS — E039 Hypothyroidism, unspecified: Secondary | ICD-10-CM | POA: Diagnosis not present

## 2023-09-03 DIAGNOSIS — I10 Essential (primary) hypertension: Secondary | ICD-10-CM | POA: Diagnosis not present

## 2023-09-03 LAB — LAB REPORT - SCANNED: EGFR: 57

## 2023-09-17 DIAGNOSIS — M898X9 Other specified disorders of bone, unspecified site: Secondary | ICD-10-CM | POA: Diagnosis not present

## 2023-09-17 DIAGNOSIS — M2011 Hallux valgus (acquired), right foot: Secondary | ICD-10-CM | POA: Diagnosis not present

## 2023-09-17 DIAGNOSIS — M79674 Pain in right toe(s): Secondary | ICD-10-CM | POA: Diagnosis not present

## 2023-09-17 DIAGNOSIS — S93104S Unspecified dislocation of right toe(s), sequela: Secondary | ICD-10-CM | POA: Diagnosis not present

## 2023-09-17 DIAGNOSIS — Q72899 Other reduction defects of unspecified lower limb: Secondary | ICD-10-CM | POA: Diagnosis not present

## 2023-09-17 DIAGNOSIS — M2041 Other hammer toe(s) (acquired), right foot: Secondary | ICD-10-CM | POA: Diagnosis not present

## 2023-09-17 DIAGNOSIS — M2061 Acquired deformities of toe(s), unspecified, right foot: Secondary | ICD-10-CM | POA: Diagnosis not present

## 2023-09-23 ENCOUNTER — Ambulatory Visit: Admitting: Oncology

## 2023-09-29 DIAGNOSIS — M898X9 Other specified disorders of bone, unspecified site: Secondary | ICD-10-CM | POA: Diagnosis not present

## 2023-09-29 DIAGNOSIS — M7741 Metatarsalgia, right foot: Secondary | ICD-10-CM | POA: Diagnosis not present

## 2023-09-29 DIAGNOSIS — E039 Hypothyroidism, unspecified: Secondary | ICD-10-CM | POA: Diagnosis not present

## 2023-09-29 DIAGNOSIS — M2041 Other hammer toe(s) (acquired), right foot: Secondary | ICD-10-CM | POA: Diagnosis not present

## 2023-09-29 DIAGNOSIS — Z853 Personal history of malignant neoplasm of breast: Secondary | ICD-10-CM | POA: Diagnosis not present

## 2023-09-29 DIAGNOSIS — I10 Essential (primary) hypertension: Secondary | ICD-10-CM | POA: Diagnosis not present

## 2023-09-29 DIAGNOSIS — Z7901 Long term (current) use of anticoagulants: Secondary | ICD-10-CM | POA: Diagnosis not present

## 2023-09-29 DIAGNOSIS — Z79899 Other long term (current) drug therapy: Secondary | ICD-10-CM | POA: Diagnosis not present

## 2023-09-29 DIAGNOSIS — I110 Hypertensive heart disease with heart failure: Secondary | ICD-10-CM | POA: Diagnosis not present

## 2023-09-29 DIAGNOSIS — M2040 Other hammer toe(s) (acquired), unspecified foot: Secondary | ICD-10-CM | POA: Diagnosis not present

## 2023-09-29 DIAGNOSIS — Q72899 Other reduction defects of unspecified lower limb: Secondary | ICD-10-CM | POA: Diagnosis not present

## 2023-09-29 DIAGNOSIS — M21611 Bunion of right foot: Secondary | ICD-10-CM | POA: Diagnosis not present

## 2023-09-29 DIAGNOSIS — I4891 Unspecified atrial fibrillation: Secondary | ICD-10-CM | POA: Diagnosis not present

## 2023-09-29 DIAGNOSIS — E7800 Pure hypercholesterolemia, unspecified: Secondary | ICD-10-CM | POA: Diagnosis not present

## 2023-09-29 DIAGNOSIS — M2061 Acquired deformities of toe(s), unspecified, right foot: Secondary | ICD-10-CM | POA: Diagnosis not present

## 2023-09-29 DIAGNOSIS — G43909 Migraine, unspecified, not intractable, without status migrainosus: Secondary | ICD-10-CM | POA: Diagnosis not present

## 2023-09-29 DIAGNOSIS — I509 Heart failure, unspecified: Secondary | ICD-10-CM | POA: Diagnosis not present

## 2023-10-05 ENCOUNTER — Other Ambulatory Visit: Payer: Self-pay | Admitting: Cardiology

## 2023-10-07 DIAGNOSIS — M79671 Pain in right foot: Secondary | ICD-10-CM | POA: Diagnosis not present

## 2023-10-28 DIAGNOSIS — M79671 Pain in right foot: Secondary | ICD-10-CM | POA: Diagnosis not present

## 2023-11-11 DIAGNOSIS — Z23 Encounter for immunization: Secondary | ICD-10-CM | POA: Diagnosis not present

## 2023-11-23 NOTE — Progress Notes (Unsigned)
 Cardiology Office Note:    Date:  11/24/2023   ID:  Gloria Harrington, DOB 05/25/1945, MRN [id number]  PCP:  Fernand Tracey LABOR, MD  Cardiologist:  Redell Leiter, MD    Referring MD: Fernand Tracey LABOR, MD    ASSESSMENT:    1. Chronic atrial fibrillation (HCC)   2. Chronic anticoagulation   3. Hypertensive heart disease with chronic diastolic congestive heart failure (HCC)   4. Nonrheumatic mitral valve regurgitation   5. Mixed hyperlipidemia    PLAN:    In order of problems listed above:  [name] continues to do well with her atrial fibrillation rate controlled with beta-blocker and continue her anticoagulant without bleeding complication Heart failure is nicely compensated she will continue her usual furosemide  20 mg daily along with metoprolol  and Entresto .  Recent echocardiogram showed normal function She has mild valvular heart disease not uncommon in her age group Continue her current lipid-lowering therapy pravastatin  and Zetia    Next appointment: 1 year   Medication Adjustments/Labs and Tests Ordered: Current medicines are reviewed at length with the patient today.  Concerns regarding medicines are outlined above.  No orders of the defined types were placed in this encounter.  No orders of the defined types were placed in this encounter.    History of Present Illness:    Gloria Harrington is a 78 y.o. female with a hx of chronic atrial fibrillation with rate control and anticoagulation hypertensive heart disease with chronic heart failure most recent ejection fraction 55 to 60% mild mitral regurgitation and hyperlipidemia last seen 11/05/2022.  Compliance with diet, lifestyle and medications: Yes  She is somewhat upset today about her husband having a myocardial infarction.  She asked me how people know they have CAD we talked about symptom recognition and doing testing such as a CTA.  She does not want to do further testing at this time Except  for the limitations from podiatry surgery on the right foot she can no longer [facility] she feels well and is not having symptoms of atrial fibrillation with palpitation no bleeding with her anticoagulant no chest pain shortness of breath or edema Interim visit with my partners her diuretic was decreased she developed edema and went back to her previous diuretic dosage Recent labs in August cholesterol 209 LDL 132 A1c 0.65 hemoglobin 13.3 creatinine 1.0 potassium 4.6 Past Medical History:  Diagnosis Date   Acute idiopathic gout involving toe of right foot 07/12/2019   AF (amaurosis fugax) 08/17/2018   Breast cancer (HCC) 10/28/2016   Ductal carcinoma of left breast   Cardiomyopathy, secondary (HCC) 12/24/2016   Carotid atherosclerosis 12/24/2016   Chronic anticoagulation 08/14/2014   Chronic atrial fibrillation (HCC) 08/14/2014   Overview:  CHADS2 vasc score=4   Chronic combined systolic and diastolic heart failure (HCC) 12/24/2016   EF 40% felt to be due to tachycardia   Coronary artery calcification seen on CT scan 08/14/2014   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   Left breast with lumpectomy and planned XRT   Drug-induced hyperkalemia 03/24/2016   Essential hypertension 08/14/2014   Hyperkalemia 03/24/2016   Hyperlipidemia 08/14/2014   Hypertensive heart disease 08/14/2014   Hypothyroid 09/17/2016   Mitral regurgitation 04/21/2017   Osteopenia after menopause 08/24/2020   PAF (paroxysmal atrial fibrillation) (HCC) 08/14/2014   Overview:  CHADS2 vasc score=4   Palpitation 09/17/2016   Primary osteoarthritis of first carpometacarpal joint of right hand 08/28/2017   PUD (peptic ulcer  disease) 12/24/2016    Current Medications: Current Meds  Medication Sig   HYDROcodone-acetaminophen (NORCO) 10-325 MG tablet Take 1 tablet by mouth 3 (three) times daily as needed.   [DISCONTINUED] docusate sodium (COLACE) 100 MG capsule Take 100 mg by mouth 2 (two) times  daily as needed.      EKGs/Labs/Other Studies Reviewed:    The following studies were reviewed today:  Cardiac Studies & Procedures   ______________________________________________________________________________________________   STRESS TESTS  MYOCARDIAL PERFUSION IMAGING 02/02/2017  Interpretation Summary  The nuclear perfusion study is normal.  Gated images not obtained due to atrial fibrillation and PVCs  There was no ST segment deviation noted during stress. Frequent PVCs were noted including bigeminal PVCs  This is a low risk study.   ECHOCARDIOGRAM  ECHOCARDIOGRAM COMPLETE 06/23/2023  Narrative ECHOCARDIOGRAM REPORT    Patient Name:   Gloria Harrington [facility] Date of Exam: 06/23/2023 Medical Rec #:  [id number]                    Height:       65.0 in Accession #:    [id number]                   Weight:       149.6 lb Date of Birth:  1945-12-16                    BSA:          1.748 m Patient Age:    77 years                     BP:           128/70 mmHg Patient Gender: F                            HR:           83 bpm. Exam Location:  [place] [facility]  Procedure: 2D Echo, Cardiac Doppler, Color Doppler and Strain Analysis (Both Spectral and Color Flow Doppler were utilized during procedure).  Indications:    Nonrheumatic mitral valve regurgitation [I34.0 (ICD-10-CM)]  History:        Patient has prior history of Echocardiogram examinations, most recent 08/14/2021. CHF, Mitral Valve Disease, Arrythmias:Atrial Fibrillation; Risk Factors:Dyslipidemia and Hypertension.  Sonographer:    Charlie Jointer RDCS Referring Phys: [id number] ALEAN SAUNDERS MADIREDDY  IMPRESSIONS   1. Left ventricular ejection fraction, by estimation, is 60 to 65%. The left ventricle has normal function. The left ventricle has no regional wall motion abnormalities. Left ventricular diastolic parameters are indeterminate. 2. Right ventricular systolic function is normal. The right ventricular size  is normal. There is mildly elevated pulmonary artery systolic pressure. The estimated right ventricular systolic pressure is 34.8 mmHg. 3. Left atrial size was mildly dilated. 4. The mitral valve is degenerative. Mild mitral valve regurgitation. No evidence of mitral stenosis. 5. The aortic valve is tricuspid. There is mild thickening of the aortic valve. Aortic valve regurgitation is mild to moderate. No aortic stenosis is present. 6. The inferior vena cava is normal in size with greater than 50% respiratory variability, suggesting right atrial pressure of 3 mmHg. 7. Rhythm strip during this exam demonstrates atrial fibrillation.  Comparison(s): In comparison prior TTE from 08/14/2021 reported LVEF 60-65%, mild MR, mild AI, mild TR and RVSP 28mm Hg.  FINDINGS Left Ventricle: Left ventricular ejection fraction, by estimation, is 60 to  65%. The left ventricle has normal function. The left ventricle has no regional wall motion abnormalities. Global longitudinal strain performed but not reported based on interpreter judgement due to suboptimal tracking. The left ventricular internal cavity size was normal in size. There is no left ventricular hypertrophy. Left ventricular diastolic parameters are indeterminate.  Right Ventricle: The right ventricular size is normal. Right vetricular wall thickness was not well visualized. Right ventricular systolic function is normal. There is mildly elevated pulmonary artery systolic pressure. The tricuspid regurgitant velocity is 2.82 m/s, and with an assumed right atrial pressure of 3 mmHg, the estimated right ventricular systolic pressure is 34.8 mmHg.  Left Atrium: Left atrial size was mildly dilated.  Right Atrium: Right atrial size was normal in size.  Pericardium: There is no evidence of pericardial effusion.  Mitral Valve: The mitral valve is degenerative in appearance. Mild mitral annular calcification. Mild mitral valve regurgitation. No evidence of  mitral valve stenosis.  Tricuspid Valve: The tricuspid valve is not well visualized. Tricuspid valve regurgitation is mild . No evidence of tricuspid stenosis.  Aortic Valve: The aortic valve is tricuspid. There is mild thickening of the aortic valve. Aortic valve regurgitation is mild to moderate. Aortic regurgitation PHT measures 535 msec. No aortic stenosis is present. Aortic valve mean gradient measures 2.8 mmHg. Aortic valve peak gradient measures 6.4 mmHg. Aortic valve area, by VTI measures 2.02 cm.  Pulmonic Valve: The pulmonic valve was not well visualized. Pulmonic valve regurgitation is not visualized. No evidence of pulmonic stenosis.  Aorta: The aortic root and ascending aorta are structurally normal, with no evidence of dilitation.  Venous: The inferior vena cava is normal in size with greater than 50% respiratory variability, suggesting right atrial pressure of 3 mmHg.  IAS/Shunts: The interatrial septum was not well visualized.  EKG: Rhythm strip during this exam demonstrates atrial fibrillation.   LEFT VENTRICLE PLAX 2D LVIDd:         3.60 cm   Diastology LVIDs:         2.50 cm   LV e' medial:    12.00 cm/s LV PW:         0.80 cm   LV E/e' medial:  12.9 LV IVS:        0.80 cm   LV e' lateral:   12.00 cm/s LVOT diam:     1.80 cm   LV E/e' lateral: 12.9 LV SV:         49 LV SV Index:   28 LVOT Area:     2.54 cm   RIGHT VENTRICLE             IVC RV Basal diam:  2.60 cm     IVC diam: 1.70 cm RV Mid diam:    2.50 cm RV S prime:     12.17 cm/s TAPSE (M-mode): 1.7 cm  LEFT ATRIUM             Index        RIGHT ATRIUM           Index LA diam:        3.70 cm 2.12 cm/m   RA Area:     15.00 cm LA Vol (A2C):   46.6 ml 26.65 ml/m  RA Volume:   34.60 ml  19.79 ml/m LA Vol (A4C):   55.8 ml 31.91 ml/m LA Biplane Vol: 52.6 ml 30.08 ml/m AORTIC VALVE AV Area (Vmax):    1.94 cm AV Area (Vmean):   2.10  cm AV Area (VTI):     2.02 cm AV Vmax:           126.73  cm/s AV Vmean:          76.585 cm/s AV VTI:            0.240 m AV Peak Grad:      6.4 mmHg AV Mean Grad:      2.8 mmHg LVOT Vmax:         96.43 cm/s LVOT Vmean:        63.133 cm/s LVOT VTI:          0.191 m LVOT/AV VTI ratio: 0.79 AI PHT:            535 msec AR Vena Contracta: 0.30 cm  AORTA Ao Root diam: 3.10 cm Ao Asc diam:  3.20 cm Ao Desc diam: 1.80 cm  MITRAL VALVE                  TRICUSPID VALVE MV Area (PHT): 7.66 cm       TR Peak grad:   31.8 mmHg MV Decel Time: 99 msec        TR Vmax:        282.00 cm/s MR Peak grad:    66.3 mmHg MR Mean grad:    46.0 mmHg    SHUNTS MR Vmax:         407.00 cm/s  Systemic VTI:  0.19 m MR Vmean:        324.0 cm/s   Systemic Diam: 1.80 cm MR PISA:         1.01 cm MR PISA Eff ROA: 10 mm MR PISA Radius:  0.40 cm MV E velocity: 155.00 cm/s  Sreedhar reddy Madireddy Electronically signed by Alean reddy Madireddy Signature Date/Time: 06/23/2023/7:21:09 PM    Final          ______________________________________________________________________________________________          Recent Labs: No results found for requested labs within last 365 days.  Recent Lipid Panel    Component Value Date/Time   CHOL 207 (H) 11/05/2022 1000   TRIG 142 11/05/2022 1000   HDL 50 11/05/2022 1000   CHOLHDL 4.1 11/05/2022 1000   LDLCALC 132 (H) 11/05/2022 1000    Physical Exam:    VS:  BP 120/70   Pulse 84   Ht 5' 5 (1.651 m)   Wt 145 lb 9.6 oz (66 kg)   LMP  (LMP Unknown)   SpO2 98%   BMI 24.23 kg/m     Wt Readings from Last 3 Encounters:  11/24/23 145 lb 9.6 oz (66 kg)  05/27/23 149 lb 9.6 oz (67.9 kg)  11/05/22 147 lb 3.2 oz (66.8 kg)     GEN:  Well nourished, well developed in no acute distress HEENT: Normal NECK: No JVD; No carotid bruits LYMPHATICS: No lymphadenopathy CARDIAC: RRR, no murmurs, rubs, gallops RESPIRATORY:  Clear to auscultation without rales, wheezing or rhonchi  ABDOMEN: Soft, non-tender,  non-distended MUSCULOSKELETAL:  No edema; No deformity  SKIN: Warm and dry NEUROLOGIC:  Alert and oriented x 3 PSYCHIATRIC:  Normal affect    Signed, Redell Leiter, MD  11/24/2023 9:34 AM    [facility]

## 2023-11-24 ENCOUNTER — Encounter: Payer: Self-pay | Admitting: Cardiology

## 2023-11-24 ENCOUNTER — Ambulatory Visit: Attending: Cardiology | Admitting: Cardiology

## 2023-11-24 VITALS — BP 120/70 | HR 84 | Ht 65.0 in | Wt 145.6 lb

## 2023-11-24 DIAGNOSIS — Z7901 Long term (current) use of anticoagulants: Secondary | ICD-10-CM | POA: Diagnosis not present

## 2023-11-24 DIAGNOSIS — I110 Hypertensive heart disease with heart failure: Secondary | ICD-10-CM

## 2023-11-24 DIAGNOSIS — I482 Chronic atrial fibrillation, unspecified: Secondary | ICD-10-CM

## 2023-11-24 DIAGNOSIS — I340 Nonrheumatic mitral (valve) insufficiency: Secondary | ICD-10-CM

## 2023-11-24 DIAGNOSIS — E782 Mixed hyperlipidemia: Secondary | ICD-10-CM

## 2023-11-24 DIAGNOSIS — I5032 Chronic diastolic (congestive) heart failure: Secondary | ICD-10-CM

## 2023-11-24 MED ORDER — FUROSEMIDE 20 MG PO TABS: 20.0000 mg | ORAL_TABLET | Freq: Every day | ORAL | 3 refills | Status: AC

## 2023-11-24 MED ORDER — SACUBITRIL-VALSARTAN 49-51 MG PO TABS: 1.0000 | ORAL_TABLET | Freq: Two times a day (BID) | ORAL | 3 refills | Status: AC

## 2023-11-24 NOTE — Patient Instructions (Signed)
 Medication Instructions:  Your physician has recommended you make the following change in your medication:   START: Lasix  20 mg daily  *If you need a refill on your cardiac medications before your next appointment, please call your pharmacy*  Lab Work: Your physician recommends that you return for lab work in:   Labs to be done in February 2026: CMP, Lipids, Pro BNP  If you have labs (blood work) drawn today and your tests are completely normal, you will receive your results only by: [employer] (if you have MyChart) OR A paper copy in the mail If you have any lab test that is abnormal or we need to change your treatment, we will call you to review the results.  Testing/Procedures: None  Follow-Up: At [facility], you and your health needs are our priority.  As part of our continuing mission to provide you with exceptional heart care, our providers are all part of one team.  This team includes your primary Cardiologist (physician) and Advanced Practice Providers or APPs (Physician Assistants and Nurse Practitioners) who all work together to provide you with the care you need, when you need it.  Your next appointment:   1 year(s)  Provider:   Redell Leiter, MD    We recommend signing up for the patient portal called MyChart.  Sign up information is provided on this After Visit Summary.  MyChart is used to connect with patients for Virtual Visits (Telemedicine).  Patients are able to view lab/test results, encounter notes, upcoming appointments, etc.  Non-urgent messages can be sent to your provider as well.   To learn more about what you can do with MyChart, go to [email or web address].   Other Instructions None

## 2023-11-26 LAB — HM MAMMOGRAPHY

## 2023-12-03 ENCOUNTER — Telehealth: Payer: Self-pay | Admitting: Oncology

## 2023-12-03 ENCOUNTER — Other Ambulatory Visit: Payer: Self-pay | Admitting: Oncology

## 2023-12-03 ENCOUNTER — Inpatient Hospital Stay: Attending: Oncology | Admitting: Oncology

## 2023-12-03 ENCOUNTER — Encounter: Payer: Self-pay | Admitting: Oncology

## 2023-12-03 VITALS — BP 125/73 | HR 97 | Temp 97.7°F | Resp 18 | Ht 65.0 in | Wt 146.1 lb

## 2023-12-03 DIAGNOSIS — Z7901 Long term (current) use of anticoagulants: Secondary | ICD-10-CM | POA: Insufficient documentation

## 2023-12-03 DIAGNOSIS — D0512 Intraductal carcinoma in situ of left breast: Secondary | ICD-10-CM | POA: Diagnosis not present

## 2023-12-03 DIAGNOSIS — Z86000 Personal history of in-situ neoplasm of breast: Secondary | ICD-10-CM | POA: Insufficient documentation

## 2023-12-03 DIAGNOSIS — Z08 Encounter for follow-up examination after completed treatment for malignant neoplasm: Secondary | ICD-10-CM | POA: Insufficient documentation

## 2023-12-03 DIAGNOSIS — M85859 Other specified disorders of bone density and structure, unspecified thigh: Secondary | ICD-10-CM | POA: Insufficient documentation

## 2023-12-03 DIAGNOSIS — Z79899 Other long term (current) drug therapy: Secondary | ICD-10-CM | POA: Insufficient documentation

## 2023-12-03 DIAGNOSIS — Z923 Personal history of irradiation: Secondary | ICD-10-CM | POA: Insufficient documentation

## 2023-12-03 DIAGNOSIS — I4891 Unspecified atrial fibrillation: Secondary | ICD-10-CM | POA: Insufficient documentation

## 2023-12-03 DIAGNOSIS — I509 Heart failure, unspecified: Secondary | ICD-10-CM | POA: Insufficient documentation

## 2023-12-03 NOTE — Progress Notes (Signed)
 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  This was found through a screening mammogram.   She was treated with lumpectomy.  Pathology revealed 44 mm, grade 2, ductal carcinoma in situ with apocrine features involving a complex sclerosing lesion with multiple foci of sclerosing adenosis.  No invasive cancer was seen. She had a history of 2 prior benign breast biopsies.  She received adjuvant radiation completed in January.  Bone density scan in December revealed osteopenia with a T-score of the spine of -1.3 which is improved by 2.7%, and the T-score of the femur at -2.0 which represents a 13.3% worsening.  She has multiple other medical comorbidities including atrial fibrillation, heart failure and history of gastric culture.  She  declined chemoprevention.  She underwent testing for hereditary cancer syndromes with the [employer] Hereditary Cancer panel.  This did not reveal any clinically significant mutation, but she did have 4 variants of uncertain significance in the APC, DICER1, TERT, and TSC1 genes.  Bilateral diagnostic mammogram in September 2020 did not reveal any evidence of malignancy.  She has CHF and sees Dr. Monetta.  She states her disease has been well controlled with Entresto .    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 Patient states that she feels well and has no complaints of pain. She informed me that she had toe surgery 2 months ago with Dr.  Burt and is wearing a boot today. She is still completing physical therapy. She had an echocardiogram performed on 06/23/2023 which revealed a LV EF of 60-65%. She continues to have atrial fibrillation and congestive heart failure. She had a bilateral diagnostic mammogram on 11/26/2023 which was negative. She states that she had a bone density scan in August and I will confirm the results of this. Dr. Fernand completes her labs routinely. I will see her back in 1 year for a physical exam. I instructed her to call if she needs to see me sooner.  She denies fever, chills, night sweats, or other signs of infection. She denies cardiorespiratory and gastrointestinal issues. She  denies pain. Her appetite is good and Her weight has decreased 1 pound in 1 year and 6 months.  REVIEW OF SYSTEMS:  Review of Systems  Constitutional: Negative.  Negative for appetite change, chills, diaphoresis, fatigue, fever and unexpected weight change.  HENT:  Negative.  Negative for hearing loss, lump/mass, mouth sores, nosebleeds, sore throat, tinnitus, trouble swallowing and voice change.   Eyes: Negative.  Negative for eye problems and icterus.  Respiratory: Negative.  Negative for chest tightness, cough, hemoptysis, shortness of breath and wheezing.   Cardiovascular: Negative.  Negative for chest pain, leg swelling and palpitations.  Gastrointestinal: Negative.  Negative for abdominal distention, abdominal pain, blood in stool, constipation, diarrhea, nausea, rectal pain and vomiting.  Endocrine: Negative.   Genitourinary: Negative.  Negative for bladder incontinence, difficulty urinating, dyspareunia, dysuria, frequency, hematuria, menstrual problem, nocturia, pelvic pain, vaginal bleeding and vaginal discharge.  Musculoskeletal: Negative.  Negative for arthralgias, back pain, flank pain, gait problem, myalgias, neck pain and neck stiffness.  Skin: Negative.  Negative for itching, rash and wound.  Neurological: Negative.   Negative for dizziness, extremity weakness, gait problem, headaches, light-headedness, numbness, seizures and speech difficulty.  Hematological: Negative.  Negative for adenopathy. Does not bruise/bleed easily.  Psychiatric/Behavioral: Negative.  Negative for confusion, decreased concentration, depression, sleep disturbance and suicidal ideas. The patient is not nervous/anxious.     VITALS:  Blood pressure 125/73, pulse 97, temperature 97.7 F (36.5 C), temperature source Oral, resp. rate 18, height 5' 5 (1.651 m), weight 146 lb 1.6 oz (66.3 kg), SpO2 99%.  Wt Readings from Last 3 Encounters:  12/03/23 146 lb 1.6 oz (66.3 kg)  11/24/23 145 lb 9.6 oz (66 kg)  05/27/23 149 lb 9.6 oz (67.9 kg)    Body mass index is 24.31 kg/m.  Performance status (ECOG): 0 - Asymptomatic  PHYSICAL EXAM:  Physical Exam Vitals and nursing note reviewed.  Constitutional:      General: She is not in acute distress.    Appearance: Normal appearance. She is normal weight. She is not ill-appearing, toxic-appearing or diaphoretic.  HENT:     Head: Normocephalic and atraumatic.     Right Ear: Tympanic membrane, ear canal and external ear normal. There is no impacted cerumen.     Left Ear: Tympanic membrane, ear canal and external ear normal. There is no impacted cerumen.     Nose: Nose normal. No congestion or rhinorrhea.     Mouth/Throat:     Mouth: Mucous membranes are moist.     Pharynx: Oropharynx is clear. No oropharyngeal exudate or posterior oropharyngeal erythema.  Eyes:     General: No scleral icterus.       Right eye: No discharge.        Left eye: No discharge.     Extraocular Movements: Extraocular movements intact.     Conjunctiva/sclera: Conjunctivae normal.     Pupils: Pupils are equal, round, and reactive to light.  Neck:     Vascular: No carotid bruit.  Cardiovascular:     Rate and Rhythm: Normal rate and regular rhythm.     Pulses: Normal pulses.     Heart sounds: Normal heart  sounds. No murmur heard.    No friction rub. No gallop.     Comments: Irregularly regular, A-fib Pulmonary:     Effort: Pulmonary effort is normal. No respiratory distress.     Breath sounds: Normal breath sounds. No stridor. No wheezing, rhonchi or rales.  Chest:     Chest wall: No tenderness.  Breasts:    Right: Normal.     Left: Normal.     Comments: Well-healed scar in the upper outer quadrant of the right breast. Well-healed scar in the superior left breast with some mild firmness above it. No masses in either breast. Abdominal:     General: Bowel sounds are normal. There is no distension.     Palpations: Abdomen is soft. There is no hepatomegaly, splenomegaly or mass.     Tenderness: There is no abdominal tenderness. There is no right CVA tenderness, left CVA tenderness, guarding or rebound.     Hernia: No hernia is present.  Musculoskeletal:        General: No swelling, tenderness, deformity or signs of injury. Normal range of motion.     Cervical back: Normal range of motion and neck supple. No rigidity or tenderness.  Right lower leg: No edema.     Left lower leg: No edema.     Right ankle: No swelling.     Left ankle: No swelling.  Lymphadenopathy:     Cervical: No cervical adenopathy.  Skin:    General: Skin is warm and dry.     Coloration: Skin is not jaundiced or pale.     Findings: No bruising, erythema, lesion or rash.  Neurological:     General: No focal deficit present.     Mental Status: She is alert and oriented to person, place, and time. Mental status is at baseline.     Cranial Nerves: No cranial nerve deficit.     Sensory: No sensory deficit.     Motor: No weakness.     Coordination: Coordination normal.     Gait: Gait normal.     Deep Tendon Reflexes: Reflexes normal.  Psychiatric:        Mood and Affect: Mood normal.        Behavior: Behavior normal.        Thought Content: Thought content normal.        Judgment: Judgment normal.    LABS:       Latest Ref Rng & Units 08/29/2021   12:00 AM 09/12/2020   12:00 AM  CBC EXTENDED  WBC  5.7     4.6   RBC 3.87 - 5.11 4.46     4.17   Hemoglobin 12.0 - 16.0 13.7     13.0   HCT 36 - 46 39     37   Platelets 150 - 400 K/uL 204     185   NEUT#  4.60          This result is from an external source.      Latest Ref Rng & Units 11/05/2022   10:00 AM 02/18/2022   11:22 AM 08/29/2021   12:00 AM  CMP  Glucose 70 - 99 mg/dL 896  87    BUN 8 - 27 mg/dL 23  24  18       Creatinine 0.57 - 1.00 mg/dL 9.00  8.83  0.9      Sodium 134 - 144 mmol/L 142  142  142      Potassium 3.5 - 5.2 mmol/L 4.6  4.7  4.2      Chloride 96 - 106 mmol/L 103  102  104      CO2 20 - 29 mmol/L 23  23  28       Calcium 8.7 - 10.3 mg/dL 9.5  89.9  89.8      Total Protein 6.0 - 8.5 g/dL 7.2  7.2    Total Bilirubin 0.0 - 1.2 mg/dL 0.8  0.6    Alkaline Phos 44 - 121 IU/L 67  65  53      AST 0 - 40 IU/L 22  18  17       ALT 0 - 32 IU/L 20  16  18          This result is from an external source.    Component Ref Range & Units 1 mo ago (04/08/22) 3 mo ago (02/18/22) 1 yr ago (02/19/21)  Cholesterol, Total 100 - 199 mg/dL 737 High  721 High  775 High   Triglycerides 0 - 149 mg/dL 837 High  785 High  824 High   HDL >39 mg/dL 47 48 55  VLDL Cholesterol Cal 5 - 40 mg/dL 30 41 High  31  LDL Chol Calc (NIH) 0 - 99 mg/dL 814 High  810 High  861 High   Chol/HDL Ratio 0.0 - 4.4 ratio 5.6 High  5.8 High  CM 4.1 CM   Component Ref Range & Units 3 mo ago (02/18/22) 2 yr ago (02/14/20) 3 yr ago (02/15/19) 4 yr ago (02/02/18) 4 yr ago (12/29/17)  NT-Pro BNP 0 - 738 pg/mL 1,416 High  1,627 High  R, CM 2,144 High  R, CM 1,158 High  R, CM 1,384 High  R, CM      STUDIES:  EXAM: 11/26/2023 MAM DIGITAL W/TOMO DIAG B IMPRESSION: OVERALL FINAL ASSESSMENT: BIRADS 2: BENIGN FINDING.  EXAM: 06/23/2023 ECHOCARDIOGRAM IMPRESSION: 1. Left ventricular ejection fraction, by estimation, is 60 to 65%. The left ventricle has normal  function. The left ventricle has no regional wall motion abnormalities. Left ventricular diastolic parameters are  indeterminate.   2. Right ventricular systolic function is normal. The right ventricular size is normal. There is mildly elevated pulmonary artery systolic pressure. The estimated right ventricular systolic pressure is 34.8 mmHg.   3. Left atrial size was mildly dilated.   4. The mitral valve is degenerative. Mild mitral valve regurgitation. No evidence of mitral stenosis.   5. The aortic valve is tricuspid. There is mild thickening of the aortic valve. Aortic valve regurgitation is mild to moderate. No aortic stenosis is present.   6. The inferior vena cava is normal in size with greater than 50% respiratory variability, suggesting right atrial pressure of 3 mmHg.   7. Rhythm strip during this exam demonstrates atrial fibrillation.   Exam: 05/14/2022 Digital Diagnostic Unilateral Right Mammogram with Tomosynthesis; ultrasound right breast Impression:   Stable probable benign asymmetry and nodule in the right breast.     Allergies:  Allergies  Allergen Reactions   Sulfamethoxazole-Trimethoprim Swelling    Tongue swelling   Clindamycin Other (See Comments)    unknown   Nitrofurantoin Other (See Comments)    unknown   Penicillins Itching    On feet   Prednisone Other (See Comments)    Fever and shaking   Latex Rash    Current Medications: Current Outpatient Medications  Medication Sig Dispense Refill   promethazine (PHENERGAN) 12.5 MG tablet      calcium-vitamin D (OSCAL WITH D) 500-200 MG-UNIT tablet Take 1 tablet by mouth 2 (two) times daily.     dicyclomine (BENTYL) 10 MG capsule Take 10 mg by mouth 3 (three) times daily as needed for pain.     ELIQUIS  5 MG TABS tablet Take 1 tablet (5 mg total) by mouth 2 (two) times daily. 180 tablet 3   ezetimibe  (ZETIA ) 10 MG tablet TAKE 1 TABLET(10 MG) BY MOUTH DAILY 90 tablet 3   furosemide  (LASIX ) 20 MG tablet Take 1  tablet (20 mg total) by mouth daily. 90 tablet 3   HYDROcodone-acetaminophen (NORCO) 10-325 MG tablet Take 1 tablet by mouth 3 (three) times daily as needed.     levothyroxine (SYNTHROID, LEVOTHROID) 75 MCG tablet Take 75 mcg by mouth daily.  0   metoprolol  tartrate (LOPRESSOR ) 50 MG tablet TAKE 1 TABLET(50 MG) BY MOUTH TWICE DAILY 180 tablet 2   Multiple Vitamin (MULTI-VITAMINS) TABS Take 1 tablet by mouth daily.     pantoprazole (PROTONIX) 40 MG tablet Take 40 mg by mouth daily.  12   pravastatin  (PRAVACHOL ) 40 MG tablet TAKE 1 TABLET(40 MG) BY MOUTH DAILY 90 tablet 1   sacubitril -valsartan  (ENTRESTO ) 49-51 MG Take 1 tablet  by mouth 2 (two) times daily. 180 tablet 3   No current facility-administered medications for this visit.    ASSESSMENT & PLAN:  Assessment:   Stage 0 left breast cancer She had ductal carcinoma in situ of the left breast in October of 2018.  She remains without evidence of disease.    Plan: She informed me that she had toe surgery 2 months ago with Dr. Burt and is wearing a boot today. She is still completing physical therapy. She had an echocardiogram performed on 06/23/2023 which revealed a LV EF of 60-65%. She continues to have atrial fibrillation and congestive heart failure. She had a bilateral diagnostic mammogram on 11/26/2023 which was negative. She states that she had a bone density scan in August and I will confirm the results of this. Dr. Fernand completes her labs routinely. I will see her back in 1 year for a physical exam. I instructed her to call if she needs to see me sooner. The patient understands the plans discussed today and is in agreement.  She knows to contact our office if she develops concerns regarding her breast cancer.  I provided 13 minutes of face-to-face time during this this encounter and > 50% was spent counseling as documented under my assessment and plan.    Wanda VEAR Cornish, MD  [facility]  [facility] [address] [place] [address] Dept: [phone] Dept Fax: [phone]   I have reviewed this report as typed by the medical scribe, and it is complete and accurate.

## 2023-12-03 NOTE — Telephone Encounter (Signed)
 Patient has been scheduled for follow-up visit per 11/2023 LOS.  Pt given an appt calendar with date and time.

## 2023-12-23 ENCOUNTER — Encounter: Payer: Self-pay | Admitting: Genetic Counselor

## 2024-02-18 ENCOUNTER — Other Ambulatory Visit: Payer: Self-pay | Admitting: Cardiology

## 2024-02-18 DIAGNOSIS — I482 Chronic atrial fibrillation, unspecified: Secondary | ICD-10-CM

## 2024-02-18 DIAGNOSIS — I480 Paroxysmal atrial fibrillation: Secondary | ICD-10-CM

## 2024-12-01 ENCOUNTER — Inpatient Hospital Stay: Admitting: Oncology
